# Patient Record
Sex: Female | Born: 1955
Health system: Southern US, Community
[De-identification: ages and names within clinical notes are randomized; demographics above are authoritative.]

## PROBLEM LIST (undated history)

## (undated) DIAGNOSIS — E079 Disorder of thyroid, unspecified: Secondary | ICD-10-CM

## (undated) DIAGNOSIS — I10 Essential (primary) hypertension: Secondary | ICD-10-CM

## (undated) DIAGNOSIS — E119 Type 2 diabetes mellitus without complications: Secondary | ICD-10-CM

## (undated) HISTORY — DX: Type 2 diabetes mellitus without complications: E11.9

## (undated) HISTORY — PX: COLONOSCOPY: SHX174

---

## 2014-10-10 ENCOUNTER — Other Ambulatory Visit: Payer: Self-pay | Admitting: Obstetrics & Gynecology

## 2014-10-10 ENCOUNTER — Other Ambulatory Visit (HOSPITAL_COMMUNITY)
Admission: RE | Admit: 2014-10-10 | Discharge: 2014-10-10 | Disposition: A | Discharge status: Home / Self Care | Payer: BLUE CROSS/BLUE SHIELD | Source: Ambulatory Visit | Attending: Obstetrics & Gynecology | Admitting: Obstetrics & Gynecology

## 2014-10-10 DIAGNOSIS — Z1231 Encounter for screening mammogram for malignant neoplasm of breast: Secondary | ICD-10-CM

## 2014-10-10 DIAGNOSIS — Z01419 Encounter for gynecological examination (general) (routine) without abnormal findings: Secondary | ICD-10-CM | POA: Insufficient documentation

## 2014-10-10 DIAGNOSIS — Z1151 Encounter for screening for human papillomavirus (HPV): Secondary | ICD-10-CM | POA: Insufficient documentation

## 2014-10-14 LAB — CYTOLOGY - PAP

## 2014-10-15 LAB — HM DEXA SCAN

## 2014-10-29 ENCOUNTER — Ambulatory Visit: Payer: BLUE CROSS/BLUE SHIELD

## 2015-01-26 ENCOUNTER — Encounter (HOSPITAL_COMMUNITY): Payer: Self-pay | Admitting: Emergency Medicine

## 2015-01-26 ENCOUNTER — Emergency Department (HOSPITAL_COMMUNITY): Payer: BLUE CROSS/BLUE SHIELD

## 2015-01-26 ENCOUNTER — Emergency Department (HOSPITAL_COMMUNITY)
Admission: EM | Admit: 2015-01-26 | Discharge: 2015-01-26 | Disposition: A | Discharge status: Home / Self Care | Payer: BLUE CROSS/BLUE SHIELD | Attending: Physician Assistant | Admitting: Physician Assistant

## 2015-01-26 DIAGNOSIS — Z79899 Other long term (current) drug therapy: Secondary | ICD-10-CM | POA: Insufficient documentation

## 2015-01-26 DIAGNOSIS — E079 Disorder of thyroid, unspecified: Secondary | ICD-10-CM | POA: Insufficient documentation

## 2015-01-26 DIAGNOSIS — W230XXA Caught, crushed, jammed, or pinched between moving objects, initial encounter: Secondary | ICD-10-CM | POA: Insufficient documentation

## 2015-01-26 DIAGNOSIS — S62664A Nondisplaced fracture of distal phalanx of right ring finger, initial encounter for closed fracture: Secondary | ICD-10-CM | POA: Diagnosis not present

## 2015-01-26 DIAGNOSIS — Y9289 Other specified places as the place of occurrence of the external cause: Secondary | ICD-10-CM | POA: Insufficient documentation

## 2015-01-26 DIAGNOSIS — S62662A Nondisplaced fracture of distal phalanx of right middle finger, initial encounter for closed fracture: Secondary | ICD-10-CM | POA: Insufficient documentation

## 2015-01-26 DIAGNOSIS — S62639A Displaced fracture of distal phalanx of unspecified finger, initial encounter for closed fracture: Secondary | ICD-10-CM

## 2015-01-26 DIAGNOSIS — Y998 Other external cause status: Secondary | ICD-10-CM | POA: Insufficient documentation

## 2015-01-26 DIAGNOSIS — Y9389 Activity, other specified: Secondary | ICD-10-CM | POA: Insufficient documentation

## 2015-01-26 DIAGNOSIS — S6991XA Unspecified injury of right wrist, hand and finger(s), initial encounter: Secondary | ICD-10-CM | POA: Diagnosis present

## 2015-01-26 DIAGNOSIS — I10 Essential (primary) hypertension: Secondary | ICD-10-CM | POA: Insufficient documentation

## 2015-01-26 HISTORY — DX: Essential (primary) hypertension: I10

## 2015-01-26 HISTORY — DX: Disorder of thyroid, unspecified: E07.9

## 2015-01-26 MED ORDER — OXYCODONE-ACETAMINOPHEN 5-325 MG PO TABS: 2.0000 | ORAL_TABLET | ORAL | Status: DC | PRN

## 2015-01-26 MED ORDER — OXYCODONE-ACETAMINOPHEN 5-325 MG PO TABS
1.0000 | ORAL_TABLET | Freq: Once | ORAL | Status: AC
  Administered 2015-01-26: 1 via ORAL
  Filled 2015-01-26: qty 1

## 2015-01-26 NOTE — ED Provider Notes (Signed)
CSN: [id number]     Arrival date & time 01/26/15  1057 History   First MD Initiated Contact with Patient 01/26/15 1101     Chief Complaint  Patient presents with  . Hand Injury   (Consider location/radiation/quality/duration/timing/severity/associated sxs/prior Treatment) The history is provided by the patient and the spouse. No language interpreter was used.  Ms. Kimberly Combs is a 59 y.o female with a history of thyroid disease and hypertension who presents for right hand paint after trying to pull the door down.  She states the garage door has 3 parts and the hand got stuck in the hinge between 2 sections.  She reports immediate pain.  She denies treatment prior to arrival.  She is right handed. She denies any numbness or tingling.    Past Medical History  Diagnosis Date  . Thyroid disease   . Hypertension    History reviewed. No pertinent past surgical history. No family history on file. Social History  Substance Use Topics  . Smoking status: Never Smoker   . Smokeless tobacco: None  . Alcohol Use: No   OB History    No data available     Review of Systems  Musculoskeletal: Positive for arthralgias.  Skin: Negative for color change and wound.  Neurological: Negative for numbness.      Allergies  Review of patient's allergies indicates no known allergies.  Home Medications   Prior to Admission medications   Medication Sig Start Date End Date Taking? Authorizing Provider  losartan (COZAAR) 100 MG tablet Take 1 tablet by mouth once daily 10/20/14  Yes Historical Provider, MD  SYNTHROID 150 MCG tablet Take 1 tablet by mouth once daily before breakfast 11/02/14  Yes Historical Provider, MD  oxyCODONE-acetaminophen (PERCOCET/ROXICET) 5-325 MG tablet Take 2 tablets by mouth every 4 (four) hours as needed for severe pain. 01/26/15   Darthy Manganelli Patel-Mills, PA-C   BP 140/72 mmHg  Pulse 64  Temp(Src) 97.5 F (36.4 C) (Oral)  Resp 16  SpO2 98% Physical Exam  Constitutional: She is  oriented to person, place, and time. She appears well-developed and well-nourished.  HENT:  Head: Normocephalic and atraumatic.  Eyes: Conjunctivae are normal.  Neck: Normal range of motion. Neck supple.  Cardiovascular: Normal rate.   Pulmonary/Chest: Effort normal. No respiratory distress.  Musculoskeletal: Normal range of motion.  Right hand: <2 second capillary refill in all fingers.  Able to flex and extend all fingers.  Tenderness along the 2nd, 3rd, and 4th distal phalanx but no deformity.  2+ radial pulse. No wrist pain and able to flex and extend wrist. No abrasion or laceration.   Neurological: She is alert and oriented to person, place, and time.  Skin: Skin is warm and dry.  Nursing note and vitals reviewed.   ED Course  Procedures (including critical care time) Labs Review Labs Reviewed - No data to display  Imaging Review Dg Hand Complete Right  01/26/2015  CLINICAL DATA:  [place] door slammed on fingers.  Pain. EXAM: RIGHT HAND - COMPLETE 3+ VIEW COMPARISON:  None. FINDINGS: There are nondisplaced fractures of the distal third and fourth phalanges. These fractures demonstrate no intra-articular extension. There is no associated foreign body or dislocation. IMPRESSION: Nondisplaced fractures of the distal third and fourth phalanges. Electronically Signed   By: Richardean Sale M.D.   On: 01/26/2015 11:47   I have personally reviewed and evaluated these image results as part of my medical decision-making.   EKG Interpretation None      MDM  Final diagnoses:  Distal phalanx or phalanges, closed fracture, initial encounter  Patient presents for right hand injury that occurred just prior to arrival. Her exam is not concerning for dislocation but may have distal phalanx fractures.  Xray of the right hand shows nondisplaced fractures of the distal third and fourth phalanx. The fingers were buddy taped. I discussed follow-up with hand surgery. I also explained that this  would take a couple weeks to heal. She was given instructions to take ibuprofen or Tylenol for pain and Percocet for breakthrough pain. She is agreeable with plan.  Medications  oxyCODONE-acetaminophen (PERCOCET/ROXICET) 5-325 MG per tablet 1 tablet (1 tablet Oral Given 01/26/15 1110)   Filed Vitals:   01/26/15 1107 01/26/15 1241  BP:  140/72  Pulse:  64  Temp: 97.5 F (36.4 C)   Resp:  [address], PA-C 01/26/15 1419  Courteney Lyn Mackuen, MD 01/26/15 1512

## 2015-01-26 NOTE — Discharge Instructions (Signed)
Finger Fracture Follow-up with the hand surgeon. Take Tylenol or Motrin for pain and Percocet for breakthrough pain. Fractures of fingers are breaks in the bones of the fingers. There are many types of fractures. There are different ways of treating these fractures. Your health care provider will discuss the best way to treat your fracture. CAUSES Traumatic injury is the main cause of broken fingers. These include:  Injuries while playing sports.  Workplace injuries.  Falls. RISK FACTORS Activities that can increase your risk of finger fractures include:  Sports.  Workplace activities that involve machinery.  A condition called osteoporosis, which can make your bones less dense and cause them to fracture more easily. SIGNS AND SYMPTOMS The main symptoms of a broken finger are pain and swelling within 15 minutes after the injury. Other symptoms include:  Bruising of your finger.  Stiffness of your finger.  Numbness of your finger.  Exposed bones (compound fracture) if the fracture is severe. DIAGNOSIS  The best way to diagnose a broken bone is with X-ray imaging. Additionally, your health care provider will use this X-ray image to evaluate the position of the broken finger bones.  TREATMENT  Finger fractures can be treated with:   Nonreduction--This means the bones are in place. The finger is splinted without changing the positions of the bone pieces. The splint is usually left on for about a week to 10 days. This will depend on your fracture and what your health care provider thinks.  Closed reduction--The bones are put back into position without using surgery. The finger is then splinted.  Open reduction and internal fixation--The fracture site is opened. Then the bone pieces are fixed into place with pins or some type of hardware. This is seldom required. It depends on the severity of the fracture. HOME CARE INSTRUCTIONS   Follow your health care provider's instructions  regarding activities, exercises, and physical therapy.  Only take over-the-counter or prescription medicines for pain, discomfort, or fever as directed by your health care provider. SEEK MEDICAL CARE IF: You have pain or swelling that limits the motion or use of your fingers. SEEK IMMEDIATE MEDICAL CARE IF:  Your finger becomes numb. MAKE SURE YOU:   Understand these instructions.  Will watch your condition.  Will get help right away if you are not doing well or get worse.   This information is not intended to replace advice given to you by your health care provider. Make sure you discuss any questions you have with your health care provider.   Document Released: 05/02/2000 Document Revised: 11/08/2012 Document Reviewed: 08/30/2012 Elsevier Interactive Patient Education [employer].

## 2015-01-26 NOTE — ED Notes (Signed)
Per pt, states got right index, middle, and ring finger caught in garage door

## 2015-10-14 ENCOUNTER — Other Ambulatory Visit: Payer: Self-pay | Admitting: Obstetrics & Gynecology

## 2015-10-14 DIAGNOSIS — Z1231 Encounter for screening mammogram for malignant neoplasm of breast: Secondary | ICD-10-CM

## 2015-10-22 ENCOUNTER — Ambulatory Visit
Admission: RE | Admit: 2015-10-22 | Discharge: 2015-10-22 | Disposition: A | Discharge status: Home / Self Care | Payer: BLUE CROSS/BLUE SHIELD | Source: Ambulatory Visit | Attending: Obstetrics & Gynecology | Admitting: Obstetrics & Gynecology

## 2015-10-22 DIAGNOSIS — Z1231 Encounter for screening mammogram for malignant neoplasm of breast: Secondary | ICD-10-CM

## 2016-09-14 ENCOUNTER — Other Ambulatory Visit: Payer: Self-pay | Admitting: Obstetrics & Gynecology

## 2016-09-14 DIAGNOSIS — Z1231 Encounter for screening mammogram for malignant neoplasm of breast: Secondary | ICD-10-CM

## 2016-11-01 ENCOUNTER — Ambulatory Visit: Payer: BLUE CROSS/BLUE SHIELD

## 2016-12-07 ENCOUNTER — Ambulatory Visit: Payer: BLUE CROSS/BLUE SHIELD

## 2016-12-29 LAB — HM DIABETES EYE EXAM

## 2017-05-31 ENCOUNTER — Ambulatory Visit: Payer: BLUE CROSS/BLUE SHIELD | Admitting: Internal Medicine

## 2017-05-31 ENCOUNTER — Encounter: Payer: Self-pay | Admitting: Gastroenterology

## 2017-05-31 ENCOUNTER — Encounter: Payer: Self-pay | Admitting: Neurology

## 2017-05-31 ENCOUNTER — Encounter: Payer: Self-pay | Admitting: Internal Medicine

## 2017-05-31 VITALS — BP 126/70 | HR 63 | Temp 97.8°F | Resp 14 | Ht 65.75 in | Wt 199.5 lb

## 2017-05-31 DIAGNOSIS — E039 Hypothyroidism, unspecified: Secondary | ICD-10-CM

## 2017-05-31 DIAGNOSIS — H9311 Tinnitus, right ear: Secondary | ICD-10-CM | POA: Diagnosis not present

## 2017-05-31 DIAGNOSIS — E118 Type 2 diabetes mellitus with unspecified complications: Secondary | ICD-10-CM | POA: Diagnosis not present

## 2017-05-31 DIAGNOSIS — E079 Disorder of thyroid, unspecified: Secondary | ICD-10-CM

## 2017-05-31 DIAGNOSIS — I10 Essential (primary) hypertension: Secondary | ICD-10-CM | POA: Diagnosis not present

## 2017-05-31 DIAGNOSIS — G2581 Restless legs syndrome: Secondary | ICD-10-CM | POA: Diagnosis not present

## 2017-05-31 DIAGNOSIS — Z1211 Encounter for screening for malignant neoplasm of colon: Secondary | ICD-10-CM

## 2017-05-31 DIAGNOSIS — E119 Type 2 diabetes mellitus without complications: Secondary | ICD-10-CM

## 2017-05-31 LAB — COMPREHENSIVE METABOLIC PANEL
ALT: 33 U/L (ref 0–35)
AST: 22 U/L (ref 0–37)
Albumin: 4.3 g/dL (ref 3.5–5.2)
Alkaline Phosphatase: 62 U/L (ref 39–117)
BUN: 11 mg/dL (ref 6–23)
CHLORIDE: 103 meq/L (ref 96–112)
CO2: 30 meq/L (ref 19–32)
Calcium: 9.4 mg/dL (ref 8.4–10.5)
Creatinine, Ser: 0.72 mg/dL (ref 0.40–1.20)
GFR: 87.29 mL/min (ref >60.00)
GLUCOSE: 141 mg/dL — AB (ref 70–99)
POTASSIUM: 3.8 meq/L (ref 3.5–5.1)
SODIUM: 141 meq/L (ref 135–145)
Total Bilirubin: 0.5 mg/dL (ref 0.2–1.2)
Total Protein: 7.6 g/dL (ref 6.0–8.3)

## 2017-05-31 LAB — LIPID PANEL
CHOL/HDL RATIO: 3
Cholesterol: 193 mg/dL (ref 0–200)
HDL: 74.2 mg/dL (ref >39.00)
LDL CALC: 99 mg/dL (ref 0–99)
NONHDL: 119.22
Triglycerides: 100 mg/dL (ref 0.0–149.0)
VLDL: 20 mg/dL (ref 0.0–40.0)

## 2017-05-31 LAB — TSH: TSH: 6.49 u[IU]/mL — ABNORMAL HIGH (ref 0.35–4.50)

## 2017-05-31 LAB — HEMOGLOBIN A1C: HEMOGLOBIN A1C: 7.4 % — AB (ref 4.6–6.5)

## 2017-05-31 NOTE — Progress Notes (Signed)
Subjective:    Patient ID: Kimberly Combs, female    DOB: May 07, 1955, 62 y.o.   MRN: [id number]  DOS:  05/31/2017 Type of visit - description : New patient Interval history: Needs to get established , last visit without MD about 9 months ago DM: Good compliance of medications, ambulatory CBGs in the 120s HTN: Good compliance with losartan, ambulatory BPs within normal Hypothyroidism: Due for labs Right ear tinnitus for a while, request a ENT referral.  Denies HOH. Also, 3 months history of involuntary leg movements, at night, associated with pain.  Review of Systems Denies chest pain or difficulty breathing No claudication No nausea, vomiting, diarrhea. No anxiety or depression although has some stress because she takes care of her parents who are elderly.  Past Medical History:  Diagnosis Date  . Diabetes mellitus ([place])   . Hypertension   . Thyroid disease     Past Surgical History:  Procedure Laterality Date  . CESAREAN SECTION     X2    Social History   Socioeconomic History  . Marital status: Married    Spouse name: Not on file  . Number of children: 2  . Years of education: Not on file  . Highest education level: Not on file  Occupational History  . Occupation: retired - Education officer, museum , [place]  Social Needs  . Financial resource strain: Not on file  . Food insecurity:    Worry: Not on file    Inability: Not on file  . Transportation needs:    Medical: Not on file    Non-medical: Not on file  Tobacco Use  . Smoking status: Former Smoker    Last attempt to quit: 2000    Years since quitting: 19.3  . Smokeless tobacco: Never Used  . Tobacco comment: light smoker, quit ~ 2000  Substance and Sexual Activity  . Alcohol use: No  . Drug use: Never  . Sexual activity: Not on file  Lifestyle  . Physical activity:    Days per week: Not on file    Minutes per session: Not on file  . Stress: Not on file  Relationships  . Social connections:    Talks on  phone: Not on file    Gets together: Not on file    Attends religious service: Not on file    Active member of club or organization: Not on file    Attends meetings of clubs or organizations: Not on file    Relationship status: Not on file  . Intimate partner violence:    Fear of current or ex partner: Not on file    Emotionally abused: Not on file    Physically abused: Not on file    Forced sexual activity: Not on file  Other Topics Concern  . Not on file  Social History Narrative   Born in [place]   Moved from [place] to [place] ~ 2015     Family History  Problem Relation Age of Onset  . Arthritis Mother   . Diabetes Father   . CAD Neg Hx   . Colon cancer Neg Hx   . Breast cancer Neg Hx      Allergies as of 05/31/2017   No Known Allergies     Medication List        Accurate as of 05/31/17 11:59 PM. Always use your most recent med list.          glucose blood test strip 1 each by Other route  3 (three) times daily as needed for other. Pt using Ultra Mini device   levothyroxine 88 MCG tablet Commonly known as:  SYNTHROID, LEVOTHROID Take 88 mcg by mouth daily before breakfast.   losartan 50 MG tablet Commonly known as:  COZAAR Take 50 mg by mouth daily.   metFORMIN 500 MG 24 hr tablet Commonly known as:  GLUCOPHAGE-XR Take 500 mg by mouth daily with supper.          Objective:   Physical Exam BP 126/70 (BP Location: Left Arm, Patient Position: Sitting, Cuff Size: Normal)   Pulse 63   Temp 97.8 F (36.6 C) (Oral)   Resp 14   Ht 5' 5.75" (1.67 m)   Wt 199 lb 8 oz (90.5 kg)   SpO2 97%   BMI 32.45 kg/m  General:   Well developed, well nourished . NAD.  HEENT:  Normocephalic . Face symmetric, atraumatic Lungs:  CTA B Normal respiratory effort, no intercostal retractions, no accessory muscle use. Heart: RRR,  no murmur.  no pretibial edema bilaterally Normal pedal pulses Abdomen:  Not distended, soft, non-tender. No rebound or rigidity.   Skin: Not pale.  Not jaundice Neurologic:  alert & oriented X3.  Speech normal, gait appropriate for age and unassisted Psych--  Cognition and judgment appear intact.  Cooperative with normal attention span and concentration.  Behavior appropriate. No anxious or depressed appearing.     Assessment & Plan:   Assessment (this is new patient 05/2017, used to see Dr. Delfina Redwood, this office is more conveniently located) DM: dx ~ 2014 HTN: dx ~ 2016 Hypothyroidism dx remotely   Plan: DM: Currently on metformin, ambulatory CBGs 120s.  Check a A1c and FLP HTN: On losartan, BP today is very good, reports good ambulatory BPs.  Check a CMP Hypothyroidism: Good compliance with Synthroid, checking labs Tinnitus, right ear.  Request a referral, refer to ENT RLS?  Painful, involuntary movements of the legs bilaterally, refer to neurology. Preventive care: Had a colonoscopy [place] more than 10 years ago, request a referral to GI.  Will do. Next  visit 3 months

## 2017-05-31 NOTE — Patient Instructions (Signed)
GO TO THE LAB : Get the blood work     GO TO THE FRONT DESK Schedule your next appointment for a  checkup in 3 months  

## 2017-05-31 NOTE — Progress Notes (Signed)
Pre visit review using our clinic review tool, if applicable. No additional management support is needed unless otherwise documented below in the visit note. 

## 2017-06-01 DIAGNOSIS — I10 Essential (primary) hypertension: Secondary | ICD-10-CM | POA: Insufficient documentation

## 2017-06-01 DIAGNOSIS — E119 Type 2 diabetes mellitus without complications: Secondary | ICD-10-CM | POA: Insufficient documentation

## 2017-06-01 DIAGNOSIS — E039 Hypothyroidism, unspecified: Secondary | ICD-10-CM | POA: Insufficient documentation

## 2017-06-01 DIAGNOSIS — E079 Disorder of thyroid, unspecified: Secondary | ICD-10-CM | POA: Insufficient documentation

## 2017-06-01 DIAGNOSIS — Z09 Encounter for follow-up examination after completed treatment for conditions other than malignant neoplasm: Secondary | ICD-10-CM | POA: Insufficient documentation

## 2017-06-01 NOTE — Assessment & Plan Note (Signed)
DM: Currently on metformin, ambulatory CBGs 120s.  Check a A1c and FLP HTN: On losartan, BP today is very good, reports good ambulatory BPs.  Check a CMP Hypothyroidism: Good compliance with Synthroid, checking labs Tinnitus, right ear.  Request a referral, refer to ENT RLS?  Painful, involuntary movements of the legs bilaterally, refer to neurology. Preventive care: Had a colonoscopy [place] more than 10 years ago, request a referral to GI.  Will do. Next  visit 3 months

## 2017-06-02 MED ORDER — METFORMIN HCL ER 750 MG PO TB24: 1500.0000 mg | ORAL_TABLET | Freq: Every day | ORAL | 3 refills | Status: DC

## 2017-06-02 MED ORDER — LEVOTHYROXINE SODIUM 100 MCG PO TABS: 100.0000 ug | ORAL_TABLET | Freq: Every day | ORAL | 3 refills | Status: DC

## 2017-06-02 NOTE — Addendum Note (Signed)
Addended byDamita Dunnings D on: 06/02/2017 02:49 PM   Modules accepted: Orders

## 2017-06-03 MED ORDER — GLUCOSE BLOOD VI STRP: ORAL_STRIP | 12 refills | Status: DC

## 2017-06-03 NOTE — Addendum Note (Signed)
Addended byDamita Dunnings D on: 06/03/2017 08:31 AM   Modules accepted: Orders

## 2017-06-07 ENCOUNTER — Telehealth: Payer: Self-pay | Admitting: Internal Medicine

## 2017-06-07 NOTE — Telephone Encounter (Signed)
Copied from [place] [id number]. Topic: Quick Communication - See Telephone Encounter >> Jun 07, 2017  3:46 PM Bea Graff, NT wrote: CRM for notification. See Telephone encounter for: 06/07/17. Pt calling and she does not want the levothyroxine (SYNTHROID, LEVOTHROID) 100 MCG tablet she wants the name brand Synthroid. [facility] - [place], [place] AT [facility] [address] [phone] (Phone) [phone] (Fax)

## 2017-06-08 MED ORDER — SYNTHROID 100 MCG PO TABS: 100.0000 ug | ORAL_TABLET | Freq: Every day | ORAL | 3 refills | Status: DC

## 2017-06-08 NOTE — Telephone Encounter (Signed)
Name brand sent.

## 2017-06-08 NOTE — Telephone Encounter (Signed)
LOV 05/31/17 Dr. Larose Kells Last refill 06/02/17  # 30 with 3 refills See pt. request

## 2017-06-20 ENCOUNTER — Ambulatory Visit (AMBULATORY_SURGERY_CENTER): Payer: Self-pay | Admitting: *Deleted

## 2017-06-20 ENCOUNTER — Other Ambulatory Visit: Payer: Self-pay

## 2017-06-20 VITALS — Ht 65.0 in | Wt 199.0 lb

## 2017-06-20 DIAGNOSIS — Z1211 Encounter for screening for malignant neoplasm of colon: Secondary | ICD-10-CM

## 2017-06-20 MED ORDER — SOD PICOSULFATE-MAG OX-CIT ACD 10-3.5-12 MG-GM -GM/160ML PO SOLN: 1.0000 | Freq: Once | ORAL | 0 refills | Status: AC

## 2017-06-20 NOTE — Progress Notes (Signed)
Patient denies any allergies to eggs or soy. Patient denies any problems with anesthesia/sedation. Patient denies any oxygen use at home. Patient denies taking any diet/weight loss medications or blood thinners. EMMI education assisgned to patient on colonoscopy, this was explained and instructions given to patient. Patient denies any kidney disease or problems with kidneys. Clenpiq coupon given to pt.

## 2017-06-30 ENCOUNTER — Ambulatory Visit
Admission: RE | Admit: 2017-06-30 | Discharge: 2017-06-30 | Disposition: A | Discharge status: Home / Self Care | Payer: BLUE CROSS/BLUE SHIELD | Source: Ambulatory Visit | Attending: Obstetrics & Gynecology | Admitting: Obstetrics & Gynecology

## 2017-06-30 DIAGNOSIS — Z1231 Encounter for screening mammogram for malignant neoplasm of breast: Secondary | ICD-10-CM

## 2017-07-04 ENCOUNTER — Encounter: Payer: Self-pay | Admitting: Gastroenterology

## 2017-07-04 ENCOUNTER — Ambulatory Visit (AMBULATORY_SURGERY_CENTER): Payer: BLUE CROSS/BLUE SHIELD | Admitting: Gastroenterology

## 2017-07-04 VITALS — BP 122/64 | HR 67 | Temp 98.4°F | Resp 11 | Ht 65.0 in | Wt 199.0 lb

## 2017-07-04 DIAGNOSIS — K635 Polyp of colon: Secondary | ICD-10-CM

## 2017-07-04 DIAGNOSIS — D120 Benign neoplasm of cecum: Secondary | ICD-10-CM

## 2017-07-04 DIAGNOSIS — D122 Benign neoplasm of ascending colon: Secondary | ICD-10-CM

## 2017-07-04 DIAGNOSIS — Z1211 Encounter for screening for malignant neoplasm of colon: Secondary | ICD-10-CM | POA: Diagnosis not present

## 2017-07-04 MED ORDER — SODIUM CHLORIDE 0.9 % IV SOLN: 500.0000 mL | Freq: Once | INTRAVENOUS | Status: DC

## 2017-07-04 NOTE — Progress Notes (Signed)
To recovery, report to RN, VSS. 

## 2017-07-04 NOTE — Patient Instructions (Signed)
Handout given on polyps  YOU HAD AN ENDOSCOPIC PROCEDURE TODAY: Refer to the procedure report and other information in the discharge instructions given to you for any specific questions about what was found during the examination. If this information does not answer your questions, please call [place] office at [phone] to clarify.   YOU SHOULD EXPECT: Some feelings of bloating in the abdomen. Passage of more gas than usual. Walking can help get rid of the air that was put into your GI tract during the procedure and reduce the bloating. If you had a lower endoscopy (such as a colonoscopy or flexible sigmoidoscopy) you may notice spotting of blood in your stool or on the toilet paper. Some abdominal soreness may be present for a day or two, also.  DIET: Your first meal following the procedure should be a light meal and then it is ok to progress to your normal diet. A half-sandwich or bowl of soup is an example of a good first meal. Heavy or fried foods are harder to digest and may make you feel nauseous or bloated. Drink plenty of fluids but you should avoid alcoholic beverages for 24 hours. If you had a esophageal dilation, please see attached instructions for diet.    ACTIVITY: Your care partner should take you home directly after the procedure. You should plan to take it easy, moving slowly for the rest of the day. You can resume normal activity the day after the procedure however YOU SHOULD NOT DRIVE, use power tools, machinery or perform tasks that involve climbing or major physical exertion for 24 hours (because of the sedation medicines used during the test).   SYMPTOMS TO REPORT IMMEDIATELY: A gastroenterologist can be reached at any hour. Please call [phone]  for any of the following symptoms:  Following lower endoscopy (colonoscopy, flexible sigmoidoscopy) Excessive amounts of blood in the stool  Significant tenderness, worsening of abdominal pains  Swelling of the abdomen that is  new, acute  Fever of 100 or higher    FOLLOW UP:  If any biopsies were taken you will be contacted by phone or by letter within the next 1-3 weeks. Call [phone]  if you have not heard about the biopsies in 3 weeks.  Please also call with any specific questions about appointments or follow up tests.  

## 2017-07-04 NOTE — Progress Notes (Signed)
Pt's states no medical or surgical changes since previsit or office visit. 

## 2017-07-04 NOTE — Progress Notes (Signed)
Called to room to assist during endoscopic procedure.  Patient ID and intended procedure confirmed with present staff. Received instructions for my participation in the procedure from the performing physician.  

## 2017-07-04 NOTE — Op Note (Signed)
[facility] Patient Name: Kimberly Combs Procedure Date: 07/04/2017 10:07 AM MRN: [id number] Endoscopist: Jackquline Denmark , MD Age: 62 Referring MD:  Date of Birth: 01-09-1956 Gender: Female Account #: [id number] Procedure:                Colonoscopy Indications:              Screening for colorectal malignant neoplasm Medicines:                Monitored Anesthesia Care Procedure:                Pre-Anesthesia Assessment:                           - Prior to the procedure, a History and Physical                            was performed, and patient medications and                            allergies were reviewed. The patient is competent.                            The risks and benefits of the procedure and the                            sedation options and risks were discussed with the                            patient. All questions were answered and informed                            consent was obtained. Patient identification and                            proposed procedure were verified by the physician                            in the procedure room. Mental Status Examination:                            alert and oriented. Prophylactic Antibiotics: The                            patient does not require prophylactic antibiotics.                            Prior Anticoagulants: The patient has taken no                            previous anticoagulant or antiplatelet agents. ASA                            Grade Assessment: II - A patient with mild systemic  disease. After reviewing the risks and benefits,                            the patient was deemed in satisfactory condition to                            undergo the procedure. The anesthesia plan was to                            use monitored anesthesia care (MAC). Immediately                            prior to administration of medications, the patient                            was  re-assessed for adequacy to receive sedatives.                            The heart rate, respiratory rate, oxygen                            saturations, blood pressure, adequacy of pulmonary                            ventilation, and response to care were monitored                            throughout the procedure. The physical status of                            the patient was re-assessed after the procedure.                           After obtaining informed consent, the colonoscope                            was passed under direct vision. Throughout the                            procedure, the patient's blood pressure, pulse, and                            oxygen saturations were monitored continuously. The                            Colonoscope was introduced through the anus and                            advanced to the 2 cm into the ileum. The                            colonoscopy was performed without difficulty. The  patient tolerated the procedure well. The quality                            of the bowel preparation was adequate to identify                            polyps 6 mm and larger in size. Some retained                            stool. Aggressive suctioning and aspiration was                            performed. Photo documentation obtained. Scope In: 10:16:45 AM Scope Out: 10:38:32 AM Scope Withdrawal Time: 0 hours 18 minutes 1 second  Total Procedure Duration: 0 hours 21 minutes 47 seconds  Findings:                 Three sessile polyps were found in the ascending                            colon and cecum. The polyps were 4 mm in size.                            These polyps were removed with a cold biopsy                            forceps. Resection and retrieval were complete.                           A 6 mm polyp was found in the ascending colon. The                            polyp was sessile. The polyp was removed with a                             cold snare. Resection and retrieval were complete.                           Non-bleeding internal hemorrhoids were found during                            retroflexion. The hemorrhoids were small and Grade                            II (internal hemorrhoids that prolapse but reduce                            spontaneously). Complications:            No immediate complications. Estimated Blood Loss:     Estimated blood loss: none. Impression:               - Colonic polyps status post polypectomy.                           -  Small Internal hemorrhoids. Recommendation:           - Resume previous diet.                           - Continue present medications.                           - Await pathology results.                           - Repeat colonoscopy for surveillance based on                            pathology results. Jackquline Denmark, MD 07/04/2017 10:45:52 AM This report has been signed electronically.

## 2017-07-05 ENCOUNTER — Telehealth: Payer: Self-pay | Admitting: *Deleted

## 2017-07-05 NOTE — Telephone Encounter (Signed)
  Follow up Call-  Call back number 07/04/2017  Post procedure Call Back phone  # [phone]  Permission to leave phone message Yes     Patient questions:  All of the message is in [place].  Daughter staed yesterday that she would answer, but did not.  No message left.

## 2017-07-05 NOTE — Telephone Encounter (Signed)
No answer, recording in Spanish, no opportunity to leave a message.

## 2017-07-07 ENCOUNTER — Other Ambulatory Visit: Payer: BLUE CROSS/BLUE SHIELD

## 2017-07-07 ENCOUNTER — Encounter: Payer: Self-pay | Admitting: Neurology

## 2017-07-07 ENCOUNTER — Ambulatory Visit (INDEPENDENT_AMBULATORY_CARE_PROVIDER_SITE_OTHER): Payer: BLUE CROSS/BLUE SHIELD | Admitting: Neurology

## 2017-07-07 VITALS — BP 130/70 | HR 68 | Ht 65.0 in | Wt 199.0 lb

## 2017-07-07 DIAGNOSIS — G2581 Restless legs syndrome: Secondary | ICD-10-CM

## 2017-07-07 DIAGNOSIS — G4761 Periodic limb movement disorder: Secondary | ICD-10-CM

## 2017-07-07 DIAGNOSIS — Z1321 Encounter for screening for nutritional disorder: Secondary | ICD-10-CM

## 2017-07-07 DIAGNOSIS — E119 Type 2 diabetes mellitus without complications: Secondary | ICD-10-CM

## 2017-07-07 MED ORDER — GABAPENTIN 100 MG PO CAPS: 300.0000 mg | ORAL_CAPSULE | Freq: Every day | ORAL | 2 refills | Status: DC

## 2017-07-07 NOTE — Progress Notes (Signed)
NEUROLOGY CONSULTATION NOTE  Kimberly Combs MRN: [id number] DOB: [birth year]  Referring provider: Dr. Larose Kells Primary care provider: Dr. Larose Kells  Reason for consult:  Restless leg syndrome  HISTORY OF PRESENT ILLNESS: Kimberly Combs is a 62 year old right-handed female with type 2 diabetes mellitus, hypothyroidism and hypertension who presents for restless leg syndrome.  She is accompanied by her husband who supplements history.  She reports uncomfortable sensations in her legs at night.  When she lays in bed, she reports a "creepy crawly" sensation with some numbness.  She denies pain or weakness. She denies back pain.  She only gets relief when she moves her legs.  Her husband says she will kick in her sleep as well.  It is not noticeable during the day.  05/31/17:  CMP with Na 141, K 3.8, glucose 141, BUN 11, Cr 0.72, t bili 0.5, ALP 62, AST 22, ALT 33; Hgb A1c 7.4, TSH 6.49  PAST MEDICAL HISTORY: Past Medical History:  Diagnosis Date  . Diabetes mellitus ([place])   . Hypertension   . Thyroid disease     PAST SURGICAL HISTORY: Past Surgical History:  Procedure Laterality Date  . CESAREAN SECTION  [id number]   X2  . COLONOSCOPY  10 years ago    in [place]="normal exam" per pt    MEDICATIONS: Current Outpatient Medications on File Prior to Visit  Medication Sig Dispense Refill  . acetaminophen (TYLENOL) 500 MG tablet Take 500 mg by mouth every 6 (six) hours as needed.    Marland Kitchen b complex vitamins tablet Take 1 tablet by mouth daily.    Marland Kitchen glucose blood test strip Check blood sugar three times daily Pt using Ultra Mini device 300 each 12  . losartan (COZAAR) 50 MG tablet Take 50 mg by mouth daily.    . metFORMIN (GLUCOPHAGE XR) 750 MG 24 hr tablet Take 2 tablets (1,500 mg total) by mouth daily with breakfast. 60 tablet 3  . SYNTHROID 100 MCG tablet Take 1 tablet (100 mcg total) by mouth daily before breakfast. 30 tablet 3   Current Facility-Administered Medications on File Prior to Visit    Medication Dose Route Frequency Provider Last Rate Last Dose  . 0.9 %  sodium chloride infusion  500 mL Intravenous Once Jackquline Denmark, MD        ALLERGIES: No Known Allergies  FAMILY HISTORY: Family History  Problem Relation Age of Onset  . Arthritis Mother   . Diabetes Father   . CAD Neg Hx   . Colon cancer Neg Hx   . Breast cancer Neg Hx   . Esophageal cancer Neg Hx   . Stomach cancer Neg Hx     SOCIAL HISTORY: Social History   Socioeconomic History  . Marital status: Married    Spouse name: Ray  . Number of children: 2  . Years of education: Not on file  . Highest education level: Bachelor's degree (e.g., BA, AB, BS)  Occupational History  . Occupation: retired - Education officer, museum , [place]  Social Needs  . Financial resource strain: Not on file  . Food insecurity:    Worry: Not on file    Inability: Not on file  . Transportation needs:    Medical: Not on file    Non-medical: Not on file  Tobacco Use  . Smoking status: Former Smoker    Last attempt to quit: 2000    Years since quitting: 19.4  . Smokeless tobacco: Never Used  . Tobacco comment: light smoker,  quit ~ 2000  Substance and Sexual Activity  . Alcohol use: No  . Drug use: Never  . Sexual activity: Not on file  Lifestyle  . Physical activity:    Days per week: Not on file    Minutes per session: Not on file  . Stress: Not on file  Relationships  . Social connections:    Talks on phone: Not on file    Gets together: Not on file    Attends religious service: Not on file    Active member of club or organization: Not on file    Attends meetings of clubs or organizations: Not on file    Relationship status: Not on file  . Intimate partner violence:    Fear of current or ex partner: Not on file    Emotionally abused: Not on file    Physically abused: Not on file    Forced sexual activity: Not on file  Other Topics Concern  . Not on file  Social History Narrative   Born in [place]   Moved from  [place] to [place] ~ 2015      Patient is right-handed. She lives with her husband in a 2 story house. She drinks one cup of deca coffee most days. She nad her husband walk occasionally.    REVIEW OF SYSTEMS: Constitutional: No fevers, chills, or sweats, no generalized fatigue, change in appetite Eyes: No visual changes, double vision, eye pain Ear, nose and throat: No hearing loss, ear pain, nasal congestion, sore throat Cardiovascular: No chest pain, palpitations Respiratory:  No shortness of breath at rest or with exertion, wheezes GastrointestinaI: No nausea, vomiting, diarrhea, abdominal pain, fecal incontinence Genitourinary:  No dysuria, urinary retention or frequency Musculoskeletal:  No neck pain, back pain Integumentary: No rash, pruritus, skin lesions Neurological: as above Psychiatric: No depression, insomnia, anxiety Endocrine: No palpitations, fatigue, diaphoresis, mood swings, change in appetite, change in weight, increased thirst Hematologic/Lymphatic:  No purpura, petechiae. Allergic/Immunologic: no itchy/runny eyes, nasal congestion, recent allergic reactions, rashes  PHYSICAL EXAM: Vitals:   07/07/17 1000  BP: 130/70  Pulse: 68  SpO2: 98%   General: No acute distress.  Patient appears well-groomed.  Head:  Normocephalic/atraumatic Eyes:  fundi examined but not visualized Neck: supple, no paraspinal tenderness, full range of motion Back: No paraspinal tenderness Heart: regular rate and rhythm Lungs: Clear to auscultation bilaterally. Vascular: No carotid bruits. Neurological Exam: Mental status: alert and oriented to person, place, and time, recent and remote memory intact, fund of knowledge intact, attention and concentration intact, speech fluent and not dysarthric, language intact. Cranial nerves: CN I: not tested CN II: pupils equal, round and reactive to light, visual fields intact CN III, IV, VI:  full range of motion, no nystagmus, no ptosis CN V: facial  sensation intact CN VII: upper and lower face symmetric CN VIII: hearing intact CN IX, X: gag intact, uvula midline CN XI: sternocleidomastoid and trapezius muscles intact CN XII: tongue midline Bulk & Tone: normal, no fasciculations. Motor:  5/5 throughout  Sensation:  Pinprick and vibration sensation intact. Deep Tendon Reflexes:  2+ throughout, toes downgoing.  Finger to nose testing:  Without dysmetria.  Heel to shin:  Without dysmetria.  Gait:  Normal station and stride.  Able to turn and tandem walk. Romberg negative.  IMPRESSION: Restless leg syndrome Period limb movements of sleep Type 2 diabetes mellitus  PLAN: 1.  Start gabapentin 100mg .  Take 3 pills at night (about 2 hours prior to bedtime).  If symptoms not better in 4 weeks, contact me 2.  We will check secondary causes:  B12, ferritin and CBC 3.  Optimize glycemic control 4.  Follow up in 3 months.  Thank you for allowing me to take part in the care of this patient.  Metta Clines, DO  CC:  Kathlene November, MD

## 2017-07-07 NOTE — Patient Instructions (Addendum)
1.  Start gabapentin 100mg .  Take 3 pills at night (about 2 hours prior to bedtime).  If symptoms not better in 4 weeks, contact me 2.  We will check secondary causes:  B12, ferritin and CBC 3.  Follow up in 3 months.  Your provider has requested that you have labwork completed today. Please go to [facility] ([id number]) on the second floor of this building before leaving the office today. You do not need to check in. If you are not called within 15 minutes please check with the front desk.

## 2017-07-08 LAB — CBC
HEMATOCRIT: 36.6 % (ref 35.0–45.0)
Hemoglobin: 12.2 g/dL (ref 11.7–15.5)
MCH: 26.9 pg — ABNORMAL LOW (ref 27.0–33.0)
MCHC: 33.3 g/dL (ref 32.0–36.0)
MCV: 80.8 fL (ref 80.0–100.0)
MPV: 10.6 fL (ref 7.5–12.5)
Platelets: 306 10*3/uL (ref 140–400)
RBC: 4.53 10*6/uL (ref 3.80–5.10)
RDW: 14 % (ref 11.0–15.0)
WBC: 7.4 10*3/uL (ref 3.8–10.8)

## 2017-07-08 LAB — VITAMIN B12: Vitamin B-12: 589 pg/mL (ref 200–1100)

## 2017-07-08 LAB — FERRITIN: Ferritin: 49 ng/mL (ref 20–288)

## 2017-07-11 ENCOUNTER — Telehealth: Payer: Self-pay

## 2017-07-11 NOTE — Telephone Encounter (Signed)
-----   Message from Pieter Partridge, DO sent at 07/08/2017  7:59 AM EDT ----- Labs look okay

## 2017-07-11 NOTE — Telephone Encounter (Signed)
Called Pt to advise of lab results, no answer. VM was in spanish.  I will send letter

## 2017-07-12 ENCOUNTER — Telehealth: Payer: Self-pay | Admitting: Neurology

## 2017-07-12 NOTE — Telephone Encounter (Signed)
Patient lmom needing to get results. Thanks

## 2017-07-12 NOTE — Telephone Encounter (Signed)
Called and spoke with Pt, advsd her she will receive a letter also, but that labs all looked good.

## 2017-07-12 NOTE — Telephone Encounter (Signed)
Rcvd VM from Pt, asking for a call with lab results. Called and it went to VM, it is in Spanish and there was never a tone to LM

## 2017-07-14 ENCOUNTER — Other Ambulatory Visit (INDEPENDENT_AMBULATORY_CARE_PROVIDER_SITE_OTHER): Payer: BLUE CROSS/BLUE SHIELD

## 2017-07-14 DIAGNOSIS — E039 Hypothyroidism, unspecified: Secondary | ICD-10-CM

## 2017-07-14 LAB — TSH: TSH: 4.93 u[IU]/mL — AB (ref 0.35–4.50)

## 2017-07-14 MED ORDER — LEVOTHYROXINE SODIUM 125 MCG PO TABS: 125.0000 ug | ORAL_TABLET | Freq: Every day | ORAL | 2 refills | Status: DC

## 2017-07-14 NOTE — Addendum Note (Signed)
Addended byDamita Dunnings D on: 07/14/2017 01:32 PM   Modules accepted: Orders

## 2017-07-19 NOTE — Telephone Encounter (Signed)
Called and spoke with Pt, she is requesting a copy of the labs. Advised her I will mail today.

## 2017-07-19 NOTE — Telephone Encounter (Signed)
Pt states she has questions about lab results letter she received. Best call back # [phone].

## 2017-07-21 ENCOUNTER — Encounter: Payer: Self-pay | Admitting: Gastroenterology

## 2017-08-01 DIAGNOSIS — H9311 Tinnitus, right ear: Secondary | ICD-10-CM | POA: Diagnosis not present

## 2017-08-01 DIAGNOSIS — K219 Gastro-esophageal reflux disease without esophagitis: Secondary | ICD-10-CM | POA: Diagnosis not present

## 2017-08-01 DIAGNOSIS — Z0110 Encounter for examination of ears and hearing without abnormal findings: Secondary | ICD-10-CM | POA: Diagnosis not present

## 2017-08-01 DIAGNOSIS — H6123 Impacted cerumen, bilateral: Secondary | ICD-10-CM | POA: Diagnosis not present

## 2017-08-03 DIAGNOSIS — Z01419 Encounter for gynecological examination (general) (routine) without abnormal findings: Secondary | ICD-10-CM | POA: Diagnosis not present

## 2017-08-07 LAB — HM PAP SMEAR

## 2017-09-02 ENCOUNTER — Ambulatory Visit: Payer: BLUE CROSS/BLUE SHIELD | Admitting: Internal Medicine

## 2017-09-20 ENCOUNTER — Telehealth: Payer: Self-pay | Admitting: Internal Medicine

## 2017-09-20 NOTE — Telephone Encounter (Signed)
Due for a OV, due for a TSH recheck, please call pt and arrange

## 2017-09-21 NOTE — Telephone Encounter (Signed)
LVM in spanish for pt to call the office and schedule an OV with provider.

## 2017-09-21 NOTE — Telephone Encounter (Signed)
Please call Pt- she is overdue for OV. Please schedule at her earliest convenience. Thank you.

## 2017-10-06 ENCOUNTER — Other Ambulatory Visit: Payer: Self-pay | Admitting: Internal Medicine

## 2017-10-06 ENCOUNTER — Other Ambulatory Visit: Payer: Self-pay | Admitting: Neurology

## 2017-10-31 NOTE — Progress Notes (Deleted)
NEUROLOGY FOLLOW UP OFFICE NOTE  Kimberly Combs [id number]  HISTORY OF PRESENT ILLNESS: Kimberly Combs is a 62 year old right-handed female with type 2 diabetes mellitus, hypothyroidism and hypertension who follows up for restless leg syndrome.  She is accompanied by her husband who supplements history.  ***  UPDATE: 07/07/17 LABS:  CBC with WBC 7.4, HGB 12.2, HCT 36.6, PLT 306; ferritin 49; B12 589 07/14/17:  TSH 4.93.  Current medication:  Gabapentin 300mg  at bedtime.  ***  HISTORY: She reports uncomfortable sensations in her legs at night.  When she lays in bed, she reports a "creepy crawly" sensation with some numbness.  She denies pain or weakness. She denies back pain.  She only gets relief when she moves her legs.  Her husband says she will kick in her sleep as well.  It is not noticeable during the day.  PAST MEDICAL HISTORY: Past Medical History:  Diagnosis Date  . Diabetes mellitus ([place])   . Hypertension   . Thyroid disease     MEDICATIONS: Current Outpatient Medications on File Prior to Visit  Medication Sig Dispense Refill  . acetaminophen (TYLENOL) 500 MG tablet Take 500 mg by mouth every 6 (six) hours as needed.    Marland Kitchen b complex vitamins tablet Take 1 tablet by mouth daily.    Marland Kitchen gabapentin (NEURONTIN) 100 MG capsule TAKE 3 CAPSULES(300 MG) BY MOUTH AT BEDTIME 90 capsule 2  . glucose blood test strip Check blood sugar three times daily Pt using Ultra Mini device 300 each 12  . losartan (COZAAR) 50 MG tablet Take 50 mg by mouth daily.    . metFORMIN (GLUCOPHAGE-XR) 750 MG 24 hr tablet Take 2 tablets (1,500 mg total) by mouth daily with breakfast. 60 tablet 0  . SYNTHROID 125 MCG tablet Take 1 tablet (125 mcg total) by mouth daily before breakfast. 30 tablet 0   Current Facility-Administered Medications on File Prior to Visit  Medication Dose Route Frequency Provider Last Rate Last Dose  . 0.9 %  sodium chloride infusion  500 mL Intravenous Once Jackquline Denmark, MD         ALLERGIES: No Known Allergies  FAMILY HISTORY: Family History  Problem Relation Age of Onset  . Arthritis Mother   . Diabetes Father   . CAD Neg Hx   . Colon cancer Neg Hx   . Breast cancer Neg Hx   . Esophageal cancer Neg Hx   . Stomach cancer Neg Hx    SOCIAL HISTORY: Social History   Socioeconomic History  . Marital status: Married    Spouse name: Ray  . Number of children: 2  . Years of education: Not on file  . Highest education level: Bachelor's degree (e.g., BA, AB, BS)  Occupational History  . Occupation: retired - Education officer, museum , [place]  Social Needs  . Financial resource strain: Not on file  . Food insecurity:    Worry: Not on file    Inability: Not on file  . Transportation needs:    Medical: Not on file    Non-medical: Not on file  Tobacco Use  . Smoking status: Former Smoker    Last attempt to quit: 2000    Years since quitting: 19.7  . Smokeless tobacco: Never Used  . Tobacco comment: light smoker, quit ~ 2000  Substance and Sexual Activity  . Alcohol use: No  . Drug use: Never  . Sexual activity: Not on file  Lifestyle  . Physical activity:  Days per week: Not on file    Minutes per session: Not on file  . Stress: Not on file  Relationships  . Social connections:    Talks on phone: Not on file    Gets together: Not on file    Attends religious service: Not on file    Active member of club or organization: Not on file    Attends meetings of clubs or organizations: Not on file    Relationship status: Not on file  . Intimate partner violence:    Fear of current or ex partner: Not on file    Emotionally abused: Not on file    Physically abused: Not on file    Forced sexual activity: Not on file  Other Topics Concern  . Not on file  Social History Narrative   Born in [place]   Moved from [place] to [place] ~ 2015      Patient is right-handed. She lives with her husband in a 2 story house. She drinks one cup of deca coffee most days. She nad  her husband walk occasionally.    REVIEW OF SYSTEMS: Constitutional: No fevers, chills, or sweats, no generalized fatigue, change in appetite Eyes: No visual changes, double vision, eye pain Ear, nose and throat: No hearing loss, ear pain, nasal congestion, sore throat Cardiovascular: No chest pain, palpitations Respiratory:  No shortness of breath at rest or with exertion, wheezes GastrointestinaI: No nausea, vomiting, diarrhea, abdominal pain, fecal incontinence Genitourinary:  No dysuria, urinary retention or frequency Musculoskeletal:  No neck pain, back pain Integumentary: No rash, pruritus, skin lesions Neurological: as above Psychiatric: No depression, insomnia, anxiety Endocrine: No palpitations, fatigue, diaphoresis, mood swings, change in appetite, change in weight, increased thirst Hematologic/Lymphatic:  No purpura, petechiae. Allergic/Immunologic: no itchy/runny eyes, nasal congestion, recent allergic reactions, rashes  PHYSICAL EXAM: *** General: No acute distress.  Patient appears ***-groomed.  *** body habitus. Head:  Normocephalic/atraumatic Eyes:  Fundi examined but not visualized Neck: supple, no paraspinal tenderness, full range of motion Heart:  Regular rate and rhythm Lungs:  Clear to auscultation bilaterally Back: No paraspinal tenderness Neurological Exam: alert and oriented to person, place, and time. Attention span and concentration intact, recent and remote memory intact, fund of knowledge intact.  Speech fluent and not dysarthric, language intact.  CN II-XII intact. Bulk and tone normal, muscle strength 5/5 throughout.  Sensation to light touch, temperature and vibration intact.  Deep tendon reflexes 2+ throughout, toes downgoing.  Finger to nose and heel to shin testing intact.  Gait normal, Romberg negative.  IMPRESSION: Restless leg syndrome  PLAN: ***  Metta Clines, DO  CC: Kathlene November, MD

## 2017-11-01 ENCOUNTER — Ambulatory Visit: Payer: BLUE CROSS/BLUE SHIELD | Admitting: Neurology

## 2017-11-02 ENCOUNTER — Encounter: Payer: Self-pay | Admitting: Internal Medicine

## 2017-11-02 ENCOUNTER — Ambulatory Visit (INDEPENDENT_AMBULATORY_CARE_PROVIDER_SITE_OTHER): Payer: BLUE CROSS/BLUE SHIELD | Admitting: Internal Medicine

## 2017-11-02 VITALS — BP 132/70 | HR 57 | Temp 97.9°F | Resp 14 | Ht 65.0 in | Wt 196.5 lb

## 2017-11-02 DIAGNOSIS — G2581 Restless legs syndrome: Secondary | ICD-10-CM | POA: Diagnosis not present

## 2017-11-02 DIAGNOSIS — E079 Disorder of thyroid, unspecified: Secondary | ICD-10-CM

## 2017-11-02 DIAGNOSIS — I10 Essential (primary) hypertension: Secondary | ICD-10-CM | POA: Diagnosis not present

## 2017-11-02 DIAGNOSIS — Z23 Encounter for immunization: Secondary | ICD-10-CM

## 2017-11-02 DIAGNOSIS — F419 Anxiety disorder, unspecified: Secondary | ICD-10-CM

## 2017-11-02 DIAGNOSIS — E119 Type 2 diabetes mellitus without complications: Secondary | ICD-10-CM | POA: Diagnosis not present

## 2017-11-02 DIAGNOSIS — K635 Polyp of colon: Secondary | ICD-10-CM

## 2017-11-02 LAB — BASIC METABOLIC PANEL
BUN: 12 mg/dL (ref 6–23)
CHLORIDE: 101 meq/L (ref 96–112)
CO2: 29 meq/L (ref 19–32)
Calcium: 9.6 mg/dL (ref 8.4–10.5)
Creatinine, Ser: 0.72 mg/dL (ref 0.40–1.20)
GFR: 87.16 mL/min (ref >60.00)
Glucose, Bld: 121 mg/dL — ABNORMAL HIGH (ref 70–99)
POTASSIUM: 3.7 meq/L (ref 3.5–5.1)
SODIUM: 139 meq/L (ref 135–145)

## 2017-11-02 LAB — AST: AST: 13 U/L (ref 0–37)

## 2017-11-02 LAB — HEMOGLOBIN A1C: Hgb A1c MFr Bld: 7.2 % — ABNORMAL HIGH (ref 4.6–6.5)

## 2017-11-02 LAB — TSH: TSH: 0.47 u[IU]/mL (ref 0.35–4.50)

## 2017-11-02 LAB — ALT: ALT: 19 U/L (ref 0–35)

## 2017-11-02 MED ORDER — ESCITALOPRAM OXALATE 5 MG PO TABS: 5.0000 mg | ORAL_TABLET | Freq: Every day | ORAL | 3 refills | Status: DC

## 2017-11-02 NOTE — Progress Notes (Signed)
Subjective:    Patient ID: Kimberly Combs, female    DOB: Apr 01, 1955, 62 y.o.   MRN: [id number]  DOS:  11/02/2017 Type of visit - description : f/u Interval history: Since the last office visit, had a colonoscopy, saw ENT, saw neurology: Notes reviewed DM: Meds were adjusted, good compliance Thyroid disease:  Compliant w/ med   Review of Systems Today, she reports some anxiety, related to stress and family issues.  No depression per se. Her sleep is not as good as usual. Medication?  Past Medical History:  Diagnosis Date  . Diabetes mellitus ([place])   . Hypertension   . Thyroid disease     Past Surgical History:  Procedure Laterality Date  . CESAREAN SECTION  [id number]   X2  . COLONOSCOPY  10 years ago    in [place]="normal exam" per pt    Social History   Socioeconomic History  . Marital status: Married    Spouse name: Ray  . Number of children: 2  . Years of education: Not on file  . Highest education level: Bachelor's degree (e.g., BA, AB, BS)  Occupational History  . Occupation: retired - Education officer, museum , [place]  Social Needs  . Financial resource strain: Not on file  . Food insecurity:    Worry: Not on file    Inability: Not on file  . Transportation needs:    Medical: Not on file    Non-medical: Not on file  Tobacco Use  . Smoking status: Former Smoker    Last attempt to quit: 2000    Years since quitting: 19.7  . Smokeless tobacco: Never Used  . Tobacco comment: light smoker, quit ~ 2000  Substance and Sexual Activity  . Alcohol use: No  . Drug use: Never  . Sexual activity: Not on file  Lifestyle  . Physical activity:    Days per week: Not on file    Minutes per session: Not on file  . Stress: Not on file  Relationships  . Social connections:    Talks on phone: Not on file    Gets together: Not on file    Attends religious service: Not on file    Active member of club or organization: Not on file    Attends meetings of clubs or organizations:  Not on file    Relationship status: Not on file  . Intimate partner violence:    Fear of current or ex partner: Not on file    Emotionally abused: Not on file    Physically abused: Not on file    Forced sexual activity: Not on file  Other Topics Concern  . Not on file  Social History Narrative   Born in [place]   Moved from [place] to [place] ~ 2015      Patient is right-handed. She lives with her husband in a 2 story house. She drinks one cup of deca coffee most days. She nad her husband walk occasionally.      Allergies as of 11/02/2017   No Known Allergies     Medication List        Accurate as of 11/02/17 11:59 PM. Always use your most recent med list.          acetaminophen 500 MG tablet Commonly known as:  TYLENOL Take 500 mg by mouth every 6 (six) hours as needed.   b complex vitamins tablet Take 1 tablet by mouth daily.   escitalopram 5 MG tablet Commonly known as:  LEXAPRO Take 1 tablet (5 mg total) by mouth daily.   gabapentin 100 MG capsule Commonly known as:  NEURONTIN TAKE 3 CAPSULES(300 MG) BY MOUTH AT BEDTIME   glucose blood test strip Check blood sugar three times daily Pt using Ultra Mini device   losartan 50 MG tablet Commonly known as:  COZAAR Take 50 mg by mouth daily.   metFORMIN 750 MG 24 hr tablet Commonly known as:  GLUCOPHAGE-XR Take 2 tablets (1,500 mg total) by mouth daily with breakfast.   SYNTHROID 125 MCG tablet Generic drug:  levothyroxine Take 1 tablet (125 mcg total) by mouth daily before breakfast.          Objective:   Physical Exam BP 132/70 (BP Location: Left Arm, Patient Position: Sitting, Cuff Size: Normal)   Pulse (!) 57   Temp 97.9 F (36.6 C) (Oral)   Resp 14   Ht 5\' 5"  (1.651 m)   Wt 196 lb 8 oz (89.1 kg)   SpO2 97%   BMI 32.70 kg/m   General:   Well developed, NAD, see BMI.  HEENT:  Normocephalic . Face symmetric, atraumatic Lungs:  CTA B Normal respiratory effort, no intercostal retractions, no accessory  muscle use. Heart: RRR,  no murmur.  No pretibial edema bilaterally  Skin: Not pale. Not jaundice DIABETIC FEET EXAM: No lower extremity edema Normal pedal pulses bilaterally Skin normal, nails normal, no calluses Pinprick examination of the feet normal. Neurologic:  alert & oriented X3.  Speech normal, gait appropriate for age and unassisted Psych--  Cognition and judgment appear intact.  Cooperative with normal attention span and concentration.  Behavior appropriate. No anxious or depressed appearing.      Assessment & Plan:   Assessment (this is new patient 05/2017, used to see Dr. Delfina Redwood, this office is more conveniently located) DM: dx ~ 2014 HTN: dx ~ 2016 RLS, periodic limb movement per neuro 2019 Hypothyroidism dx remotely   Plan: DM: Last A1c was 7.4, metformin increase to XR 750 mg 2 tablets daily.  Good compliance, no apparent side effects, ambulatory CBGs have not changed much (120s).  Check a A1c, BMP, LFTs. RLS: Saw neurology 07/2017, impression was RLS, periodic limb movements of sleep.  Recommended gabapentin, ferritin and B12 unremarkable.  Symptoms are about the same, plans to discuss with neurology. Hypothyroidism: Since the last visit, TSH was elevated, Synthroid medication has been adjusted twice.  Check a TSH Tinnitus saw ENT, impression was minimal presbycusis with tinnitus. No specific Rx.  Patient is counseled.  May try to mask symptoms with very low-grade background music Colon polyps had a colonoscopy 07/2017, 3 adenomatous polyps, rec redo cscope in 3 years, pt quit skeptical, results discussed, risk of future polyps turning into something serious like cancer discussed. Anxiety: As described above, no depression.  Treatment options include counseling, increase exercise and medication.  Patient request medication.  Will start very low-dose of Lexapro 5 mg, watch for side effects, follow-up in 3 months however if is not working in the next 2 to 3 weeks she  might call to increase dosing. MSK: Reports occasional aches and pains at the hips, buttocks, shoulders, typically "related to the weather".  Denies fever, chills, weight loss.  Recommend observation. RTC 3 months  Today, I spent more than 25   min with the patient: >50% of the time counseling regards anxiety management, colon polyps possible complications, reviewing the chart.  Also managing chronic issues.

## 2017-11-02 NOTE — Patient Instructions (Addendum)
GO TO THE LAB : Get the blood work     GO TO THE FRONT DESK Schedule your next appointment for a checkup in 3 months  Start taking escitalopram (Lexapro) 5 mg: 1 tablet daily  Printing will not you complete

## 2017-11-02 NOTE — Progress Notes (Signed)
Pre visit review using our clinic review tool, if applicable. No additional management support is needed unless otherwise documented below in the visit note. 

## 2017-11-03 ENCOUNTER — Other Ambulatory Visit: Payer: Self-pay | Admitting: Internal Medicine

## 2017-11-03 DIAGNOSIS — F419 Anxiety disorder, unspecified: Secondary | ICD-10-CM | POA: Insufficient documentation

## 2017-11-03 DIAGNOSIS — G2581 Restless legs syndrome: Secondary | ICD-10-CM | POA: Insufficient documentation

## 2017-11-03 DIAGNOSIS — K635 Polyp of colon: Secondary | ICD-10-CM | POA: Insufficient documentation

## 2017-11-03 NOTE — Assessment & Plan Note (Signed)
DM: Last A1c was 7.4, metformin increase to XR 750 mg 2 tablets daily.  Good compliance, no apparent side effects, ambulatory CBGs have not changed much (120s).  Check a A1c, BMP, LFTs. RLS: Saw neurology 07/2017, impression was RLS, periodic limb movements of sleep.  Recommended gabapentin, ferritin and B12 unremarkable.  Symptoms are about the same, plans to discuss with neurology. Hypothyroidism: Since the last visit, TSH was elevated, Synthroid medication has been adjusted twice.  Check a TSH Tinnitus saw ENT, impression was minimal presbycusis with tinnitus. No specific Rx.  Patient is counseled.  May try to mask symptoms with very low-grade background music Colon polyps had a colonoscopy 07/2017, 3 adenomatous polyps, rec redo cscope in 3 years, pt quit skeptical, results discussed, risk of future polyps turning into something serious like cancer discussed. Anxiety: As described above, no depression.  Treatment options include counseling, increase exercise and medication.  Patient request medication.  Will start very low-dose of Lexapro 5 mg, watch for side effects, follow-up in 3 months however if is not working in the next 2 to 3 weeks she might call to increase dosing. MSK: Reports occasional aches and pains at the hips, buttocks, shoulders, typically "related to the weather".  Denies fever, chills, weight loss.  Recommend observation. RTC 3 month

## 2017-11-03 NOTE — Telephone Encounter (Signed)
Pt requesting refill on metformin xr 750mg . A1c completed yesterday. Okay to refill?

## 2017-11-04 NOTE — Telephone Encounter (Signed)
Rx sent 

## 2017-11-04 NOTE — Telephone Encounter (Signed)
See lab results.  Needs Farxiga and  increase metformin XR 750 mg to 2 in the morning and 1 with dinner.

## 2017-11-07 ENCOUNTER — Telehealth: Payer: Self-pay | Admitting: Internal Medicine

## 2017-11-07 NOTE — Telephone Encounter (Signed)
Dosage change was sent on 11/04/2017.

## 2017-11-07 NOTE — Telephone Encounter (Signed)
Pt called in and was given message from Dr. Larose Kells regarding lab results dated 11/04/17 at 12:59PM and the addendum from 11/04/17 at 1:01 PM to increase metformin XR 750 mg to 2 in the morning and 1 with dinner.    She verbalized understanding of this medication change.   Also verbalized understanding to notify us if she develops a vaginal discharge or rash.  She has a BP machine so will monitor her BP to make sure it does not get too low.  She has a 3 month follow up scheduled in January 2020.  No Result Note was made.  Routed this message to Dr. Larose Kells so he would know she got the message and can send in the Rx for the metformin dose change.

## 2017-11-11 ENCOUNTER — Telehealth: Payer: Self-pay | Admitting: Internal Medicine

## 2017-11-11 MED ORDER — FLUOXETINE HCL 20 MG PO TABS: 20.0000 mg | ORAL_TABLET | Freq: Every day | ORAL | 1 refills | Status: DC

## 2017-11-11 NOTE — Telephone Encounter (Signed)
Please advise 

## 2017-11-11 NOTE — Telephone Encounter (Signed)
I meant to say she needs a OV in 4 weeks.  Please arrange

## 2017-11-11 NOTE — Telephone Encounter (Signed)
Dr. Larose Kells- what labs was Pt needing in 4 weeks? None pending.

## 2017-11-11 NOTE — Telephone Encounter (Signed)
Pt given information related to medication change per Dr Larose Kells; she verbalizes understanding; pt also scheduled lab appointment 12/09/17 at [place].

## 2017-11-11 NOTE — Telephone Encounter (Signed)
Copied from [place] [phone] [id number]. Topic: Quick Communication - Rx Refill/Question >> Nov 11, 2017  1:26 PM Gardiner Ramus wrote: Medication: escitalopram (LEXAPRO) 5 MG tablet [[id number]]  pt called and stated that she has been having issues with this medication. She has had stomach issues and feeling light headed with headaches. Please advise [phone]

## 2017-11-11 NOTE — Telephone Encounter (Signed)
Tried calling Pt- no answer, unable to leave message. Okay for PEC to discuss. Fluoxetine 20mg  sent to pharmacy.

## 2017-11-11 NOTE — Telephone Encounter (Addendum)
Advised patient: Stop Lexapro Try fluoxetine 20 mg tablets: Half tablet daily for 2 weeks, then 1 tablet daily.  #30, 1 refill. Schedule  a OV in 4 weeks from now

## 2017-11-14 NOTE — Telephone Encounter (Signed)
Can you call Pt-she didn't need lab appt in 4 weeks just an office visit w/ PCP.

## 2017-11-15 NOTE — Telephone Encounter (Signed)
Called pt an schedule her for fu appt on Nov 30, 2017. Done.

## 2017-11-30 ENCOUNTER — Ambulatory Visit (INDEPENDENT_AMBULATORY_CARE_PROVIDER_SITE_OTHER): Payer: BLUE CROSS/BLUE SHIELD | Admitting: Internal Medicine

## 2017-11-30 ENCOUNTER — Encounter: Payer: Self-pay | Admitting: Internal Medicine

## 2017-11-30 VITALS — BP 132/84 | HR 64 | Temp 98.0°F | Resp 16 | Ht 65.0 in | Wt 193.2 lb

## 2017-11-30 DIAGNOSIS — E119 Type 2 diabetes mellitus without complications: Secondary | ICD-10-CM

## 2017-11-30 DIAGNOSIS — F419 Anxiety disorder, unspecified: Secondary | ICD-10-CM

## 2017-11-30 DIAGNOSIS — G4700 Insomnia, unspecified: Secondary | ICD-10-CM

## 2017-11-30 MED ORDER — ZOLPIDEM TARTRATE 5 MG PO TABS: 5.0000 mg | ORAL_TABLET | Freq: Every evening | ORAL | 1 refills | Status: DC | PRN

## 2017-11-30 MED ORDER — ONETOUCH VERIO FLEX SYSTEM W/DEVICE KIT: PACK | 0 refills | Status: AC

## 2017-11-30 MED ORDER — ONETOUCH LANCETS MISC: 12 refills | Status: AC

## 2017-11-30 MED ORDER — GLUCOSE BLOOD VI STRP: ORAL_STRIP | 12 refills | Status: DC

## 2017-11-30 NOTE — Progress Notes (Signed)
Pre visit review using our clinic review tool, if applicable. No additional management support is needed unless otherwise documented below in the visit note. 

## 2017-11-30 NOTE — Patient Instructions (Signed)
  GO TO THE FRONT DESK Schedule your next appointment for a physical exam fasting in 3 months   take Ambien 1 tablet at bedtime.

## 2017-11-30 NOTE — Progress Notes (Signed)
Subjective:    Patient ID: Kimberly Combs, female    DOB: 07/30/1955, 62 y.o.   MRN: [id number]  DOS:  11/30/2017 Type of visit - description : f/u  Interval history: DM: Good compliance with metformin, I prescribed farxiga but the prescription was not sent.  She is doing better with diet, has noted some weight loss. Anxiety: Not taking any medication at this point, see assessment and plan Insomnia: Would like an Ambien prescription.  Wt Readings from Last 3 Encounters:  11/30/17 193 lb 4 oz (87.7 kg)  11/02/17 196 lb 8 oz (89.1 kg)  07/07/17 199 lb (90.3 kg)     Review of Systems Denies chest pain no difficulty breathing No nausea or vomiting.  Past Medical History:  Diagnosis Date  . Diabetes mellitus ([place])   . Hypertension   . Thyroid disease     Past Surgical History:  Procedure Laterality Date  . CESAREAN SECTION  2836,6294   X2  . COLONOSCOPY  10 years ago    in [place]="normal exam" per pt    Social History   Socioeconomic History  . Marital status: Married    Spouse name: Ray  . Number of children: 2  . Years of education: Not on file  . Highest education level: Bachelor's degree (e.g., BA, AB, BS)  Occupational History  . Occupation: retired - Education officer, museum , [place]  Social Needs  . Financial resource strain: Not on file  . Food insecurity:    Worry: Not on file    Inability: Not on file  . Transportation needs:    Medical: Not on file    Non-medical: Not on file  Tobacco Use  . Smoking status: Former Smoker    Last attempt to quit: 2000    Years since quitting: 19.8  . Smokeless tobacco: Never Used  . Tobacco comment: light smoker, quit ~ 2000  Substance and Sexual Activity  . Alcohol use: No  . Drug use: Never  . Sexual activity: Not on file  Lifestyle  . Physical activity:    Days per week: Not on file    Minutes per session: Not on file  . Stress: Not on file  Relationships  . Social connections:    Talks on phone: Not on file   Gets together: Not on file    Attends religious service: Not on file    Active member of club or organization: Not on file    Attends meetings of clubs or organizations: Not on file    Relationship status: Not on file  . Intimate partner violence:    Fear of current or ex partner: Not on file    Emotionally abused: Not on file    Physically abused: Not on file    Forced sexual activity: Not on file  Other Topics Concern  . Not on file  Social History Narrative   Born in [place]   Moved from [place] to [place] ~ 2015      Patient is right-handed. She lives with her husband in a 2 story house. She drinks one cup of deca coffee most days. She nad her husband walk occasionally.      Allergies as of 11/30/2017   No Known Allergies     Medication List        Accurate as of 11/30/17 11:59 PM. Always use your most recent med list.          acetaminophen 500 MG tablet Commonly known as:  TYLENOL  Take 500 mg by mouth every 6 (six) hours as needed.   b complex vitamins tablet Take 1 tablet by mouth daily.   gabapentin 100 MG capsule Commonly known as:  NEURONTIN TAKE 3 CAPSULES(300 MG) BY MOUTH AT BEDTIME   glucose blood test strip Check blood sugar three times daily   losartan 50 MG tablet Commonly known as:  COZAAR Take 50 mg by mouth daily.   metFORMIN 750 MG 24 hr tablet Commonly known as:  GLUCOPHAGE-XR Take 2 tablets by mouth every morning and 1 tablet by mouth every evening   ONE TOUCH LANCETS Misc Check blood sugar three times daily   ONETOUCH VERIO FLEX SYSTEM w/Device Kit Check blood sugar 3 times daily   SYNTHROID 125 MCG tablet Generic drug:  levothyroxine Take 1 tablet (125 mcg total) by mouth daily before breakfast.   zolpidem 5 MG tablet Commonly known as:  AMBIEN Take 1 tablet (5 mg total) by mouth at bedtime as needed for sleep.          Objective:   Physical Exam BP 132/84 (BP Location: Left Arm, Patient Position: Sitting, Cuff Size: Normal)    Pulse 64   Temp 98 F (36.7 C) (Oral)   Resp 16   Ht '5\' 5"'  (1.651 m)   Wt 193 lb 4 oz (87.7 kg)   SpO2 97%   BMI 32.16 kg/m  General:   Well developed, NAD, see BMI.  HEENT:  Normocephalic . Face symmetric, atraumatic Lungs:  CTA B Normal respiratory effort, no intercostal retractions, no accessory muscle use. Heart: RRR,  no murmur.  No pretibial edema bilaterally  Skin: Not pale. Not jaundice Neurologic:  alert & oriented X3.  Speech normal, gait appropriate for age and unassisted Psych--  Cognition and judgment appear intact.  Cooperative with normal attention span and concentration.  Behavior appropriate. No anxious or depressed appearing.      Assessment & Plan:   Assessment (this is new patient 05/2017, used to see Dr. Delfina Redwood, this office is more conveniently located) DM: dx ~ 2014 HTN: dx ~ 2016 Anxiety, insomnia RLS, periodic limb movement per neuro 2019 Hypothyroidism dx remotely   Plan: DM: Last A1c 7.2, I recommend to increase metformin XR 750 to 3 tablets daily and start farxiga but the RX was not sent. Good compliance w/ metformin, improving diet, has lost some weight.  For now will recommend to stay on metformin, new glucometer sent.  Come back in 3 months. Anxiety: Started Lexapro, caused headaches, tried fluoxetine for 2 days only and self stopped due to dry mouth.  At this point she does not like to pursue other SSRIs (we could re-try fluoxetine in the future) .  She requests insomnia management, she feels that if she can sleep well, anxiety will decrease.  See next Insomnia: On and off problem, previously Ambien worked really well for her, I do not see a contraindication, will start Ambien 5 mg.  Call if needs  a slightly stronger dose. RTC 3 months CPX

## 2017-12-01 DIAGNOSIS — G4700 Insomnia, unspecified: Secondary | ICD-10-CM | POA: Insufficient documentation

## 2017-12-01 NOTE — Assessment & Plan Note (Signed)
Plan: DM: Last A1c 7.2, I recommend to increase metformin XR 750 to 3 tablets daily and start farxiga but the RX was not sent. Good compliance w/ metformin, improving diet, has lost some weight.  For now will recommend to stay on metformin, new glucometer sent.  Come back in 3 months. Anxiety: Started Lexapro, caused headaches, tried fluoxetine for 2 days only and self stopped due to dry mouth.  At this point she does not like to pursue other SSRIs (we could re-try fluoxetine in the future) .  She requests insomnia management, she feels that if she can sleep well, anxiety will decrease.  See next Insomnia: On and off problem, previously Ambien worked really well for her, I do not see a contraindication, will start Ambien 5 mg.  Call if needs  a slightly stronger dose. RTC 3 months CPX

## 2017-12-09 ENCOUNTER — Other Ambulatory Visit: Payer: BLUE CROSS/BLUE SHIELD

## 2018-01-04 DIAGNOSIS — H2513 Age-related nuclear cataract, bilateral: Secondary | ICD-10-CM | POA: Diagnosis not present

## 2018-01-04 DIAGNOSIS — E119 Type 2 diabetes mellitus without complications: Secondary | ICD-10-CM | POA: Diagnosis not present

## 2018-01-04 DIAGNOSIS — Z7984 Long term (current) use of oral hypoglycemic drugs: Secondary | ICD-10-CM | POA: Diagnosis not present

## 2018-01-04 DIAGNOSIS — H18413 Arcus senilis, bilateral: Secondary | ICD-10-CM | POA: Diagnosis not present

## 2018-01-04 LAB — HM DIABETES EYE EXAM

## 2018-01-09 ENCOUNTER — Encounter: Payer: Self-pay | Admitting: Internal Medicine

## 2018-02-07 ENCOUNTER — Ambulatory Visit: Payer: BLUE CROSS/BLUE SHIELD | Admitting: Internal Medicine

## 2018-03-06 ENCOUNTER — Encounter: Payer: Self-pay | Admitting: Internal Medicine

## 2018-03-07 ENCOUNTER — Encounter: Payer: Self-pay | Admitting: Internal Medicine

## 2018-03-07 ENCOUNTER — Ambulatory Visit (INDEPENDENT_AMBULATORY_CARE_PROVIDER_SITE_OTHER): Payer: BLUE CROSS/BLUE SHIELD | Admitting: Internal Medicine

## 2018-03-07 VITALS — BP 122/74 | HR 69 | Temp 98.2°F | Resp 16 | Ht 65.0 in | Wt 193.1 lb

## 2018-03-07 DIAGNOSIS — G4700 Insomnia, unspecified: Secondary | ICD-10-CM | POA: Diagnosis not present

## 2018-03-07 DIAGNOSIS — M5441 Lumbago with sciatica, right side: Secondary | ICD-10-CM | POA: Diagnosis not present

## 2018-03-07 DIAGNOSIS — E119 Type 2 diabetes mellitus without complications: Secondary | ICD-10-CM

## 2018-03-07 LAB — HEMOGLOBIN A1C: Hgb A1c MFr Bld: 6.7 % — ABNORMAL HIGH (ref 4.6–6.5)

## 2018-03-07 MED ORDER — PREDNISONE 10 MG PO TABS: ORAL_TABLET | ORAL | 0 refills | Status: DC

## 2018-03-07 NOTE — Progress Notes (Signed)
Pre visit review using our clinic review tool, if applicable. No additional management support is needed unless otherwise documented below in the visit note. 

## 2018-03-07 NOTE — Assessment & Plan Note (Signed)
DM: Currently on metformin, reports good compliance, checking A1c.  Ambulatory CBGs in the 130s. Anxiety: Essentially resolved Insomnia: On Ambien, contract today. Back pain: new problem, pain located at the buttock, + radiculopathy features but also trochanteric bursitis type of symptoms.  Neurological exam is normal.  Recommend a low-dose prednisone for few days, stop if CBGs more than 180.  If not improving will refer to Ortho.  See AVS. Preventive care reviewed : Td 2017.  PNM 23: 2019.  Had a flu shot. CCS: Colonoscopy in [place] around 2009, colonoscopy in [facility] 07/04/2017 Female care: [facility] 06-2017.  Pap/HPV 08/03/2017 per [facility] RTC 4 months

## 2018-03-07 NOTE — Patient Instructions (Signed)
GO TO THE LAB : Get the blood work     GO TO THE FRONT DESK Schedule your next appointment checkup in 4 months, fasting  For pain:  Ice to the right hip every night  Take prednisone as prescribed, it will raise your blood sugar, if he goes more than 180 please to stop  Okay to take Tylenol  Also, okay to take IBUPROFEN (Advil or Motrin) 200 mg 2 tablets every 8 hours as needed for pain.  Always take it with food because may cause gastritis and ulcers.  If you notice nausea, stomach pain, change in the color of stools --->  Stop the medicine and let us know  Call if not gradually better in the next 2 weeks.

## 2018-03-07 NOTE — Progress Notes (Signed)
Subjective:    Patient ID: Kimberly Combs, female    DOB: 22-Jun-1955, 63 y.o.   MRN: [id number]  DOS:  03/07/2018 Type of visit - description: Routine office visit We discussed chronic issues. Anxiety: Resolved Insomnia: Controlled with Ambien 4 weeks ago developed a steady pain from the right buttock radiating down to the leg up to the plantar area of the right foot. Symptoms actually are better when she moves around and worse with rest. Tylenol does not help. She is also tender at the trochanteric bursa. Recalls no injury. DM: Good compliance with metformin, ambulatory CBGs in the 130s.    Review of Systems  Denies fever, chills. No injury or fall Admits to paresthesias, right leg: from time to time the right thigh feels heavy, "like a rock".  No motor deficits.  Past Medical History:  Diagnosis Date  . Diabetes mellitus ([place])   . Hypertension   . Thyroid disease     Past Surgical History:  Procedure Laterality Date  . CESAREAN SECTION  5621,3086   X2  . COLONOSCOPY  10 years ago    in [place]="normal exam" per pt    Social History   Socioeconomic History  . Marital status: Married    Spouse name: Ray  . Number of children: 2  . Years of education: Not on file  . Highest education level: Bachelor's degree (e.g., BA, AB, BS)  Occupational History  . Occupation: retired - Education officer, museum , [place]  Social Needs  . Financial resource strain: Not on file  . Food insecurity:    Worry: Not on file    Inability: Not on file  . Transportation needs:    Medical: Not on file    Non-medical: Not on file  Tobacco Use  . Smoking status: Former Smoker    Last attempt to quit: 2000    Years since quitting: 20.1  . Smokeless tobacco: Never Used  . Tobacco comment: light smoker, quit ~ 2000  Substance and Sexual Activity  . Alcohol use: No  . Drug use: Never  . Sexual activity: Not on file  Lifestyle  . Physical activity:    Days per week: Not on file    Minutes per  session: Not on file  . Stress: Not on file  Relationships  . Social connections:    Talks on phone: Not on file    Gets together: Not on file    Attends religious service: Not on file    Active member of club or organization: Not on file    Attends meetings of clubs or organizations: Not on file    Relationship status: Not on file  . Intimate partner violence:    Fear of current or ex partner: Not on file    Emotionally abused: Not on file    Physically abused: Not on file    Forced sexual activity: Not on file  Other Topics Concern  . Not on file  Social History Narrative   Born in [place]   Moved from [place] to [place] ~ 2015      Patient is right-handed. She lives with her husband in a 2 story house. She drinks one cup of deca coffee most days. She nad her husband walk occasionally.      Allergies as of 03/07/2018   No Known Allergies     Medication List       Accurate as of March 07, 2018  5:52 PM. Always use your most recent med  list.        acetaminophen 500 MG tablet Commonly known as:  TYLENOL Take 500 mg by mouth every 6 (six) hours as needed.   b complex vitamins tablet Take 1 tablet by mouth daily.   gabapentin 100 MG capsule Commonly known as:  NEURONTIN TAKE 3 CAPSULES(300 MG) BY MOUTH AT BEDTIME   glucose blood test strip Commonly known as:  ONETOUCH VERIO Check blood sugar three times daily   losartan 50 MG tablet Commonly known as:  COZAAR Take 50 mg by mouth daily.   metFORMIN 750 MG 24 hr tablet Commonly known as:  GLUCOPHAGE-XR Take 2 tablets by mouth every morning and 1 tablet by mouth every evening   ONE TOUCH LANCETS Misc Check blood sugar three times daily   ONETOUCH VERIO FLEX SYSTEM w/Device Kit Check blood sugar 3 times daily   predniSONE 10 MG tablet Commonly known as:  DELTASONE 3 tabs x 2 days, 2 tabs x 2 days, 1 tab x 2 days   SYNTHROID 125 MCG tablet Generic drug:  levothyroxine Take 1 tablet (125 mcg total) by mouth daily  before breakfast.   zolpidem 5 MG tablet Commonly known as:  AMBIEN Take 1 tablet (5 mg total) by mouth at bedtime as needed for sleep.           Objective:   Physical Exam BP 122/74 (BP Location: Right Arm, Patient Position: Sitting, Cuff Size: Small)   Pulse 69   Temp 98.2 F (36.8 C) (Oral)   Resp 16   Ht _0  (1.651 m)   Wt 193 lb 2 oz (87.6 kg)   SpO2 97%   BMI 32.14 kg/m  General:   Well developed, NAD, BMI noted. HEENT:  Normocephalic . Face symmetric, atraumatic Lungs:  CTA B Normal respiratory effort, no intercostal retractions, no accessory muscle use. Heart: RRR,  no murmur.  No pretibial edema bilaterally  Skin: Not pale. Not jaundice MSK: No TTP at the low back. Slightly TTP at the right trochanteric bursa Neurologic:  alert & oriented X3.  Speech normal, gait appropriate for age and unassisted DTRs symmetric.  Straight leg test negative.  Motor normal. Psych--  Cognition and judgment appear intact.  Cooperative with normal attention span and concentration.  Behavior appropriate. No anxious or depressed appearing.      Assessment     Assessment (this is new patient 05/2017, used to see Dr. Delfina Redwood, this office is more conveniently located) DM: dx ~ 2014 HTN: dx ~ 2016 Anxiety, insomnia RLS, periodic limb movement per neuro 2019 Hypothyroidism dx remotely   Plan: DM: Currently on metformin, reports good compliance, checking A1c.  Ambulatory CBGs in the 130s. Anxiety: Essentially resolved Insomnia: On Ambien, contract today. Back pain: new problem, pain located at the buttock, + radiculopathy features but also trochanteric bursitis type of symptoms.  Neurological exam is normal.  Recommend a low-dose prednisone for few days, stop if CBGs more than 180.  If not improving will refer to Ortho.  See AVS. Preventive care reviewed : Td 2017.  PNM 23: 2019.  Had a flu shot. CCS: Colonoscopy in [place] around 2009, colonoscopy in [facility]  07/04/2017 Female care: [facility] 06-2017.  Pap/HPV 08/03/2017 per [facility] RTC 4 months

## 2018-03-08 ENCOUNTER — Other Ambulatory Visit: Payer: Self-pay | Admitting: Internal Medicine

## 2018-03-10 ENCOUNTER — Telehealth: Payer: Self-pay

## 2018-03-10 NOTE — Telephone Encounter (Signed)
Copied from [place] [id number]. Topic: General - Inquiry >> Mar 10, 2018 12:33 PM Conception Chancy, NT wrote: Reason for CRM: patient is calling and would like recent lab results mailed to her

## 2018-03-13 NOTE — Telephone Encounter (Signed)
Notes recorded by Damita Dunnings, CMA on 03/07/2018 at 1:22 PM EST Results mailed. ------  Notes recorded by Colon Branch, MD on 03/07/2018 at 12:47 PM EST Send a letter Your diabetes has improved, the A1c is now 6.7, good results, continue the same medications.

## 2018-03-22 ENCOUNTER — Telehealth: Payer: Self-pay

## 2018-03-22 DIAGNOSIS — M549 Dorsalgia, unspecified: Secondary | ICD-10-CM

## 2018-03-22 DIAGNOSIS — M7060 Trochanteric bursitis, unspecified hip: Secondary | ICD-10-CM

## 2018-03-22 NOTE — Telephone Encounter (Signed)
Please advise 

## 2018-03-22 NOTE — Telephone Encounter (Signed)
Copied from [place] [phone]. Topic: Referral - Request for Referral >> Mar 22, 2018  1:02 PM Lennox Solders wrote: Has patient seen PCP for this complaint? Yes. Pt would like to proceed with getting ortho referral for right leg pain and right hip pain

## 2018-03-22 NOTE — Telephone Encounter (Signed)
Patient seen recently with back pain, question of trochanteric bursitis. Please arrange a orthopedic referral.  DX back pain, trochanteric bursitis

## 2018-03-22 NOTE — Telephone Encounter (Signed)
Referral placed.

## 2018-03-28 ENCOUNTER — Encounter (INDEPENDENT_AMBULATORY_CARE_PROVIDER_SITE_OTHER): Payer: Self-pay | Admitting: Family Medicine

## 2018-03-28 ENCOUNTER — Ambulatory Visit (INDEPENDENT_AMBULATORY_CARE_PROVIDER_SITE_OTHER): Payer: BLUE CROSS/BLUE SHIELD | Admitting: Family Medicine

## 2018-03-28 DIAGNOSIS — M25551 Pain in right hip: Secondary | ICD-10-CM

## 2018-03-28 MED ORDER — DICLOFENAC SODIUM 75 MG PO TBEC: 75.0000 mg | DELAYED_RELEASE_TABLET | Freq: Two times a day (BID) | ORAL | 3 refills | Status: DC | PRN

## 2018-03-28 NOTE — Progress Notes (Signed)
I saw and examined the patient with Dr. Okey Dupre and agree with assessment and plan as outlined.  Probable right greater trochanter syndrome, cannot rule out lumbar foraminal stenosis.  Trial of PT.  X-Rays, possibly lumbar MRI if pain persists.

## 2018-03-28 NOTE — Progress Notes (Signed)
  Kimberly Combs - 63 y.o. female MRN [id number]  Date of birth: February 24, 1955    SUBJECTIVE:      Chief Complaint: Right hip pain  HPI:  63 y/o female with lateral right hip pain for about 2 months.  Pain is localized to the lateral hip.  It radiates down the side of her leg.  She denies any groin pain.  She does note occasional tingling sensation in the posterior leg.  No symptoms into the foot.  She denies any prior injuries.  With prolonged weakness she does at times feel some weakness in the leg.  She denies any regular back pain. No bruising or erythema   ROS:     See HPI  PERTINENT  PMH / PSH FH / / SH:  Past Medical, Surgical, Social, and Family History Reviewed & Updated in the EMR.   - Diabetes  OBJECTIVE: There were no vitals taken for this visit.  Physical Exam:  Vital signs are reviewed.  GEN: Alert and oriented, NAD Pulm: Breathing unlabored PSY: normal mood, congruent affect  MSK: Right Hip:  - Inspection: No gross deformity, no swelling, erythema, or ecchymosis - Palpation: TTP over greater trochanter and along IT band - ROM: Normal range of motion on Flexion, extension, abduction, internal and external rotation - Strength: 5/5 strength with hip flexion, 4+/5 with abduction with pain - Neuro/vasc: NV intact distally - Special Tests: Negative FABER and FADIR.  Negative logroll.    ASSESSMENT & PLAN:  1. Right hip pain - 2/2 GT pain syndrome vs glute med tendindopathy - will send to physical therapy - diclofenac - f/u 6 weeks if no improvememt

## 2018-04-07 ENCOUNTER — Ambulatory Visit: Payer: BLUE CROSS/BLUE SHIELD | Attending: Family Medicine | Admitting: Physical Therapy

## 2018-04-07 ENCOUNTER — Other Ambulatory Visit: Payer: Self-pay

## 2018-04-07 DIAGNOSIS — M25551 Pain in right hip: Secondary | ICD-10-CM | POA: Diagnosis not present

## 2018-04-07 DIAGNOSIS — M25651 Stiffness of right hip, not elsewhere classified: Secondary | ICD-10-CM | POA: Diagnosis not present

## 2018-04-07 NOTE — Patient Instructions (Signed)
  Hamstring Stretch, Reclined (Strap, Doorframe)   Lengthen bottom leg on floor. Extend top leg along edge of doorframe or press foot up into yoga strap. Hold for 30-60 seconds. Repeat 3_ times each leg.   Outer Hip Stretch: Reclined IT Band Stretch (Strap)   Strap around opposite foot, pull across only as far as possible with shoulders on mat. Hold for __30-60 __ seconds. Repeat __3__ times each leg. 2-3 x/day.   Piriformis (Supine)  Cross legs, right on top. Gently pull other knee toward chest until stretch is felt in buttock/hip of top leg. Hold _30-60___ seconds. Can place bottom foot on wall.  Repeat _3___ times per set. Do ____ sets per session. Do __2__ sessions per day.  or  Hip Stretch  Put right ankle over left knee. Let right knee fall downward, but keep ankle in place. Feel the stretch in hip. May push down gently with hand to feel stretch. Hold __60__ seconds while counting out loud. Repeat with other leg. Repeat __3__ times. Do _2___ sessions per day.    Quadriceps (Prone) Do one leg at a time.   On stomach with sheet around ankles, knees together, hips down, pull heels toward bottom. Keep hips flat. Hold __30-60__ seconds. Repeat _3__ times. Do __3__ sessions per day. CAUTION: Stretch should be gentle, steady and slow.  Copyright  VHI. All rights reserved.    Achilles Tendon Stretch   Stand with hands supported on wall, elbows slightly bent, feet parallel and both heels on floor, front knee bent, back knee straight. Slowly relax back knee until a stretch is felt in achilles tendon. Hold __30-60_ seconds. Repeat with leg positions switched. Repeat with back knee slightly bent.  Madelyn Flavors, PT 04/07/18 10:13 AM [address] [place], [place], [address] Phone: [phone]   Fax:  [phone]

## 2018-04-07 NOTE — Therapy (Signed)
[address] [place], [place], [address] Phone: [phone]   Fax:  [phone]  Physical Therapy Evaluation  Patient Details  Name: Kimberly Combs MRN: [id number] Date of Birth: [birth year] Referring Provider (PT): Eunice Blase   Encounter Date: 04/07/2018  PT End of Session - 04/07/18 0937    Visit Number  1    Date for PT Re-Evaluation  05/05/18    PT Start Time  0937    PT Stop Time  1015    PT Time Calculation (min)  38 min    Activity Tolerance  Patient tolerated treatment well    Behavior During Therapy  [facility] for tasks assessed/performed       Past Medical History:  Diagnosis Date  . Diabetes mellitus ([place])   . Hypertension   . Thyroid disease     Past Surgical History:  Procedure Laterality Date  . CESAREAN SECTION  5056,9794   X2  . COLONOSCOPY  10 years ago    in [place]="normal exam" per pt    There were no vitals filed for this visit.   Subjective Assessment - 04/07/18 0938    Subjective  Patient reports insidious onset in January 2020 of tightness in right hip but has pain down her entire leg to foot. She reports some weakness as well.    Pertinent History  DM, HTN, hypothyroid    How long can you sit comfortably?  1 hour    How long can you walk comfortably?  1 hour    Diagnostic tests  none    Patient Stated Goals  get rid of pain    Currently in Pain?  Yes    Pain Score  6     Pain Location  Hip    Pain Orientation  Right    Pain Descriptors / Indicators  Tightness;Heaviness    Pain Type  Acute pain    Pain Radiating Towards  to medial arch    Pain Onset  More than a month ago    Pain Frequency  Constant    Aggravating Factors   walking and standing    Pain Relieving Factors  stretching    Effect of Pain on Daily Activities  everything hurts         [facility] PT Assessment - 04/07/18 0001      Assessment   Medical Diagnosis  right greater trochanter syndrome and possible  foraminal stenosis    Referring Provider (PT)  Eunice Blase    Prior Therapy  no      Precautions   Precautions  None      Restrictions   Weight Bearing Restrictions  No      Balance Screen   Has the patient fallen in the past 6 months  No    Has the patient had a decrease in activity level because of a fear of falling?   No    Is the patient reluctant to leave their home because of a fear of falling?   No      Home Environment   Living Environment  Private residence    Additional Comments  no problem with stairs      Prior Function   Level of Independence  Independent    Vocation  Retired    Leisure  walk, dance      Posture/Postural Control   Posture/Postural Control  No significant limitations      ROM / Strength  AROM / PROM / Strength  AROM;Strength      AROM   Overall AROM Comments  Lumbar WNL, Bil hips WFL      Strength   Overall Strength Comments  Bil hips 5/5, knee ext 5/5, flex 4+/5; bil ankle DF 5/5      Flexibility   Soft Tissue Assessment /Muscle Length  yes    Hamstrings  bil tightness    Quadriceps  bil tightness    ITB  bil tightness    Piriformis  bil tightness    Quadratus Lumborum  tight on right      Palpation   Spinal mobility  decreased in lumbar due to tightness    Palpation comment  marked tenderness in right gluteus min and med, QL      Special Tests   Other special tests  positive ober bil; neg SLR and slump on right; neg hip scour Rt                Objective measurements completed on examination: See above findings.              PT Education - 04/07/18 1024    Education Details  HEP; MFR with ball    Person(s) Educated  Patient    Methods  Explanation;Demonstration;Handout    Comprehension  Verbalized understanding;Returned demonstration       PT Short Term Goals - 04/07/18 1032      PT SHORT TERM GOAL #1   Title  Ind with initial HEP    Time  1    Period  Weeks    Status  New        PT Long  Term Goals - 04/07/18 1032      PT LONG TERM GOAL #1   Title  Patient able to amublate without pain.    Time  4    Period  Weeks    Status  New    Target Date  05/05/18      PT LONG TERM GOAL #2   Title  Patient able to sleep without waking from right hip/leg pain.    Time  4    Period  Weeks    Status  New      PT LONG TERM GOAL #3   Title  Patient to demo 5/5 knee strength to improve function.    Time  4    Period  Weeks    Status  New      PT LONG TERM GOAL #4   Title  no reports of heaviness in RLE    Time  4    Period  Weeks    Status  New             Plan - 04/07/18 1024    Clinical Impression Statement  Patient presents with c/o insidious onset of right hip and leg pain beginning in January 2020. She walks daily, but has pain down her RLE to medial arch with standing and walking. She also has pain at night disrupting her sleep. She has marked tightness in bil hips and LEs and tenderness in right gluteals. Pain appears to be of myofascial origin. She will benefit from PT to address these deficits and increase function.    Examination-Activity Limitations  Stand    Examination-Participation Restrictions  Other   walking   Stability/Clinical Decision Making  Stable/Uncomplicated    Clinical Decision Making  Low    Rehab Potential  Excellent    PT  Frequency  2x / week    PT Duration  4 weeks    PT Treatment/Interventions  ADLs/Self Care Home Management;Electrical Stimulation;Iontophoresis 4mg /ml Dexamethasone;Moist Heat;Ultrasound;Therapeutic exercise;Patient/family education;Manual techniques;Dry needling    PT Next Visit Plan  review HEP, MFR/DN to right glueals; flexibility and strengthening (HS); possibly ionto    PT Home Exercise Plan  stretches: HS, quads, PF, gastroc, ITB    Consulted and Agree with Plan of Care  Patient       Patient will benefit from skilled therapeutic intervention in order to improve the following deficits and impairments:  Pain,  Impaired flexibility, Decreased activity tolerance, Decreased strength  Visit Diagnosis: Pain in right hip - Plan: PT plan of care cert/re-cert  Stiffness of right hip, not elsewhere classified - Plan: PT plan of care cert/re-cert     Problem List Patient Active Problem List   Diagnosis Date Noted  . Insomnia 12/01/2017  . RLS (restless legs syndrome) 11/03/2017  . Anxiety 11/03/2017  . Colon polyps 11/03/2017  . PCP NOTES >>>>>>>>>>>>>>>>>>>>>> 06/01/2017  . Diabetes mellitus ([place])   . Hypertension   . Thyroid disease     Madelyn Flavors PT 04/07/2018, 10:38 AM  [address] [place], [place], [address] Phone: [phone]   Fax:  [phone]  Name: Abena Erdman MRN: [id number] Date of Birth: Jul 02, 1955

## 2018-04-11 ENCOUNTER — Ambulatory Visit: Payer: BLUE CROSS/BLUE SHIELD | Admitting: Physical Therapy

## 2018-04-11 DIAGNOSIS — M25651 Stiffness of right hip, not elsewhere classified: Secondary | ICD-10-CM

## 2018-04-11 DIAGNOSIS — M25551 Pain in right hip: Secondary | ICD-10-CM | POA: Diagnosis not present

## 2018-04-11 NOTE — Patient Instructions (Signed)
Trigger Point Dry Needling  . What is Trigger Point Dry Needling (DN)? o DN is a physical therapy technique used to treat muscle pain and dysfunction. Specifically, DN helps deactivate muscle trigger points (muscle knots).  o A thin filiform needle is used to penetrate the skin and stimulate the underlying trigger point. The goal is for a local twitch response (LTR) to occur and for the trigger point to relax. No medication of any kind is injected during the procedure.   . What Does Trigger Point Dry Needling Feel Like?  o The procedure feels different for each individual patient. Some patients report that they do not actually feel the needle enter the skin and overall the process is not painful. Very mild bleeding may occur. However, many patients feel a deep cramping in the muscle in which the needle was inserted. This is the local twitch response.   Marland Kitchen How Will I feel after the treatment? o Soreness is normal, and the onset of soreness may not occur for a few hours. Typically this soreness does not last longer than two days.  o Bruising is uncommon, however; ice can be used to decrease any possible bruising.  o In rare cases feeling tired or nauseous after the treatment is normal. In addition, your symptoms may get worse before they get better, this period will typically not last longer than 24 hours.   . What Can I do After My Treatment? o Increase your hydration by drinking more water for the next 24 hours. o You may place ice or heat on the areas treated that have become sore, however, do not use heat on inflamed or bruised areas. Heat often brings more relief post needling. o You can continue your regular activities, but vigorous activity is not recommended initially after the treatment for 24 hours. o DN is best combined with other physical therapy such as strengthening, stretching, and other therapies.     IONTOPHORESIS PATIENT PRECAUTIONS & CONTRAINDICATIONS:  . Redness under one or  both electrodes can occur.  This characterized by a uniform redness that usually disappears within 12 hours of treatment. . Small pinhead size blisters may result in response to the drug.  Contact your physician if the problem persists more than 24 hours. . On rare occasions, iontophoresis therapy can result in temporary skin reactions such as rash, inflammation, irritation or burns.  The skin reactions may be the result of individual sensitivity to the ionic solution used, the condition of the skin at the start of treatment, reaction to the materials in the electrodes, allergies or sensitivity to dexamethasone, or a poor connection between the patch and your skin.  Discontinue using iontophoresis if you have any of these reactions and report to your therapist. . Remove the Patch or electrodes if you have any undue sensation of pain or burning during the treatment and report discomfort to your therapist. . Tell your Therapist if you have had known adverse reactions to the application of electrical current.  . DO NOT use if you have a cardiac pacemaker or any other electrically sensitive implanted device. . DO NOT use if you have a known sensitivity to dexamethasone. . DO NOT use during Magnetic Resonance Imaging (MRI). . DO NOT use over broken or compromised skin (e.g. sunburn, cuts, or acne) due to the increased risk of skin reaction. . DO NOT SHAVE over the area to be treated:  To establish good contact between the Patch and the skin, excessive hair may be clipped.  Madelyn Flavors, PT 04/11/18 11:47 AM [address] [place], [place], [address] Phone: [phone]   Fax:  [phone]

## 2018-04-11 NOTE — Therapy (Addendum)
[address] [place], [place], [address] Phone: [phone]   Fax:  [phone]  Physical Therapy Treatment  Patient Details  Name: Kimberly Combs MRN: [id number] Date of Birth: 11-28-1955 Referring Provider (PT): Eunice Blase   Encounter Date: 04/11/2018  PT End of Session - 04/11/18 1106    Visit Number  2    Date for PT Re-Evaluation  05/05/18    PT Start Time  1104    PT Stop Time  1147    PT Time Calculation (min)  43 min    Activity Tolerance  Patient tolerated treatment well    Behavior During Therapy  [facility] for tasks assessed/performed       Past Medical History:  Diagnosis Date  . Diabetes mellitus ([place])   . Hypertension   . Thyroid disease     Past Surgical History:  Procedure Laterality Date  . CESAREAN SECTION  5732,2025   X2  . COLONOSCOPY  10 years ago    in [place]="normal exam" per pt    There were no vitals filed for this visit.  Subjective Assessment - 04/11/18 1106    Subjective  Patient reports compliance with HEP. she is sore today from gardening.    Currently in Pain?  Yes    Pain Score  4     Pain Location  Hip    Pain Orientation  Right    Pain Descriptors / Indicators  Tightness    Pain Type  Acute pain                       [facility] Adult PT Treatment/Exercise - 04/11/18 0001      Exercises   Exercises  Knee/Hip      Knee/Hip Exercises: Stretches   Passive Hamstring Stretch  Both;1 rep;60 seconds    Quad Stretch  Both;1 rep;30 seconds    ITB Stretch  Both;1 rep;60 seconds    Piriformis Stretch  Both;60 seconds    Gastroc Stretch  Both;1 rep;30 seconds      Knee/Hip Exercises: Aerobic   Elliptical  I5/R5 1.5 min fwd/1.5 min bwd    Nustep  L5 x 3 min      Modalities   Modalities  Iontophoresis      Iontophoresis   Type of Iontophoresis  Dexamethasone    Location  Right hip    Dose  32mmin    Time  4 hour patch      Manual Therapy   Manual  Therapy  Soft tissue mobilization;Myofascial release    Soft tissue mobilization  to Right gluteals,piriformis and lateral quads    Myofascial Release  to Right ITB       Trigger Point Dry Needling - 04/11/18 0001    Consent Given?  Yes    Education Handout Provided  Yes    Muscles Treated Back/Hip  Gluteus minimus;Gluteus medius;Gluteus maximus;Piriformis    Gluteus Minimus Response  Twitch response elicited;Palpable increased muscle length    Gluteus Medius Response  Twitch response elicited;Palpable increased muscle length    Gluteus Maximus Response  Palpable increased muscle length    Piriformis Response  Palpable increased muscle length           PT Education - 04/11/18 1210    Education Details  ionto and DN education    Person(s) Educated  Patient    Methods  Explanation;Demonstration;Handout    Comprehension  Verbalized understanding;Returned demonstration  PT Short Term Goals - 04/07/18 1032      PT SHORT TERM GOAL #1   Title  Ind with initial HEP    Time  1    Period  Weeks    Status  New        PT Long Term Goals - 04/07/18 1032      PT LONG TERM GOAL #1   Title  Patient able to amublate without pain.    Time  4    Period  Weeks    Status  New    Target Date  05/05/18      PT LONG TERM GOAL #2   Title  Patient able to sleep without waking from right hip/leg pain.    Time  4    Period  Weeks    Status  New      PT LONG TERM GOAL #3   Title  Patient to demo 5/5 knee strength to improve function.    Time  4    Period  Weeks    Status  New      PT LONG TERM GOAL #4   Title  no reports of heaviness in RLE    Time  4    Period  Weeks    Status  New            Plan - 04/11/18 1204    Clinical Impression Statement  Patient did well with TE today although elliptical was difficult. She demonstrated good form with stretches for HEP. She was educated and gave consent for both DN and iontophoresis. Good response to DN in gluteals.    PT  Treatment/Interventions  ADLs/Self Care Home Management;Electrical Stimulation;Iontophoresis 40m/ml Dexamethasone;Moist Heat;Ultrasound;Therapeutic exercise;Patient/family education;Manual techniques;Dry needling    PT Next Visit Plan  Assess repsonse to DN to right glueals and ionto; flexibility and strengthening (HS)    PT Home Exercise Plan  stretches: HS, quads, PF, gastroc, ITB       Patient will benefit from skilled therapeutic intervention in order to improve the following deficits and impairments:  Pain, Impaired flexibility, Decreased activity tolerance, Decreased strength  Visit Diagnosis: Pain in right hip  Stiffness of right hip, not elsewhere classified     Problem List Patient Active Problem List   Diagnosis Date Noted  . Insomnia 12/01/2017  . RLS (restless legs syndrome) 11/03/2017  . Anxiety 11/03/2017  . Colon polyps 11/03/2017  . PCP NOTES >>>>>>>>>>>>>>>>>>>>>> 06/01/2017  . Diabetes mellitus ([place]   . Hypertension   . Thyroid disease    PHYSICAL THERAPY DISCHARGE SUMMARY  Visits from Start of Care: 2 Plan: Patient agrees to discharge.  Patient goals were not met. Patient is being discharged due to not returning since the last visit.  ?????     JMadelyn FlavorsPT 04/11/2018, 12:12 PM  [address] [place] [address]Phone: [phone]  Fax:  [phone] Name: Kimberly CaseboltMRN: [id number] of Birth: 701/17/1957

## 2018-04-18 ENCOUNTER — Other Ambulatory Visit: Payer: Self-pay | Admitting: Internal Medicine

## 2018-04-18 ENCOUNTER — Ambulatory Visit: Payer: BLUE CROSS/BLUE SHIELD

## 2018-04-21 ENCOUNTER — Ambulatory Visit: Payer: BLUE CROSS/BLUE SHIELD

## 2018-05-08 ENCOUNTER — Other Ambulatory Visit: Payer: Self-pay | Admitting: Internal Medicine

## 2018-07-03 ENCOUNTER — Ambulatory Visit: Payer: BLUE CROSS/BLUE SHIELD | Admitting: Family Medicine

## 2018-07-04 ENCOUNTER — Ambulatory Visit: Payer: BLUE CROSS/BLUE SHIELD | Admitting: Family Medicine

## 2018-07-04 ENCOUNTER — Other Ambulatory Visit: Payer: Self-pay

## 2018-07-04 ENCOUNTER — Ambulatory Visit: Payer: Self-pay

## 2018-07-04 ENCOUNTER — Encounter: Payer: Self-pay | Admitting: Family Medicine

## 2018-07-04 ENCOUNTER — Ambulatory Visit (INDEPENDENT_AMBULATORY_CARE_PROVIDER_SITE_OTHER): Payer: BLUE CROSS/BLUE SHIELD | Admitting: Family Medicine

## 2018-07-04 DIAGNOSIS — M25562 Pain in left knee: Secondary | ICD-10-CM

## 2018-07-04 DIAGNOSIS — M25561 Pain in right knee: Secondary | ICD-10-CM | POA: Diagnosis not present

## 2018-07-04 MED ORDER — NABUMETONE 750 MG PO TABS: 750.0000 mg | ORAL_TABLET | Freq: Two times a day (BID) | ORAL | 6 refills | Status: DC | PRN

## 2018-07-04 MED ORDER — TIZANIDINE HCL 2 MG PO TABS: 2.0000 mg | ORAL_TABLET | Freq: Every evening | ORAL | 1 refills | Status: DC | PRN

## 2018-07-04 NOTE — Progress Notes (Signed)
I saw and examined the patient with Dr. Okey Dupre and agree with assessment and plan as outlined.  Left greater than right constant pain, worse with gardening.  Low back stiffness when lying down at night.  Tried NSAID, tylenol with no relief.  Trace effusion in left knee.  Trace bilateral patella crepitus.  Tender left medial joint line, no click with McMurray's.  X-Rays:  Very minimal patellofemoral spurring.  Overall knees look good.  Impression/plan:  Possible early left knee DJD.   - Try injection, meds.  PT if not improving.  MRI if fails conservative management.

## 2018-07-04 NOTE — Progress Notes (Signed)
  Kimberly Combs - 63 y.o. female MRN [id number]  Date of birth: 1955-07-26    SUBJECTIVE:      Chief Complaint: Knee pain  HPI:  63 year old female with 1 month of persistent left knee pain.  She denies any specific injury but does note doing a lot of yard work around that time.  She denies any history of significant knee pain.  Her pain is constant and is made worse with weightbearing/walking.  She has taken Tylenol and NSAIDs which have not helped.  She does get temporary relief with ice.  She denies any swelling, erythema or bruising.  No feeling of instability or locking.  She denies any numbness or tingling in the lower extremities.  She also has some pain in the right knee but less severe.  She does report some back pain at night when laying flat.  No associated fevers or chills.  No history of gout.   ROS:     See HPI. All other reviewed systems negative.  PERTINENT  PMH / PSH FH / / SH:  Past Medical, Surgical, Social, and Family History Reviewed & Updated in the EMR.   OBJECTIVE: There were no vitals taken for this visit.  Physical Exam:  Vital signs are reviewed.  GEN: Alert and oriented, NAD Pulm: Breathing unlabored PSY: normal mood, congruent affect  MSK: Left knee: - Inspection: No gross deformity.  Mild effusion. No erythema or bruising. Skin intact - Palpation: Tenderness over the medial joint line when the knee is fully flexed - ROM: full active ROM with flexion and extension in knee without crepitus - Strength: 5/5 strength - Neuro/vasc: NV intact - Special Tests: - LIGAMENTS:negative Lachman's, no MCL or LCL laxity  -- MENISCUS: negative McMurray's, -- PF JOINT: nml patellar mobility without apprehension.  Right knee: - Inspection: No gross deformity. No effusion. No erythema or bruising. Skin intact - Palpation: no TTP - ROM: full active ROM with flexion and extension in knee - Strength: 5/5 strength - Neuro/vasc: NV intact  Bilateral hips: No tenderness  over trochanter No pain with passive IR/ER   ASSESSMENT & PLAN:  1.  Acute knee pain secondary to arthritis vs meniscal injury. xrays obtained today show very mild medial joint space narrowing which does not seem to correlate well with her degree of pain. Favor dx of meniscal injury, no mechanical symptoms. - steroid injection as detailed below - relafen - zanaflex - PT referral - f/u PRN   Procedure performed: knee intraarticular corticosteroid injection; palpation guided Consent obtained and verified. Time-out conducted. Noted no overlying erythema, induration, or other signs of local infection. The LEFT lateral patellofemoral joint space was palpated and marked. The overlying skin was prepped in a sterile fashion. Topical analgesic spray: Ethyl chloride. Joint: left knee Needle: 25GA, 1.5" Completed without difficulty. Meds: lidocaine 5cc, depomedrol 40mg

## 2018-07-06 ENCOUNTER — Ambulatory Visit (INDEPENDENT_AMBULATORY_CARE_PROVIDER_SITE_OTHER): Payer: BLUE CROSS/BLUE SHIELD | Admitting: Internal Medicine

## 2018-07-06 ENCOUNTER — Other Ambulatory Visit: Payer: Self-pay

## 2018-07-06 VITALS — BP 132/78 | HR 75 | Temp 98.4°F | Ht 65.0 in | Wt 196.0 lb

## 2018-07-06 DIAGNOSIS — E079 Disorder of thyroid, unspecified: Secondary | ICD-10-CM

## 2018-07-06 DIAGNOSIS — I10 Essential (primary) hypertension: Secondary | ICD-10-CM | POA: Diagnosis not present

## 2018-07-06 DIAGNOSIS — G2581 Restless legs syndrome: Secondary | ICD-10-CM | POA: Diagnosis not present

## 2018-07-06 DIAGNOSIS — E119 Type 2 diabetes mellitus without complications: Secondary | ICD-10-CM | POA: Diagnosis not present

## 2018-07-06 LAB — BASIC METABOLIC PANEL
BUN: 15 mg/dL (ref 6–23)
CO2: 27 mEq/L (ref 19–32)
Calcium: 9.4 mg/dL (ref 8.4–10.5)
Chloride: 103 mEq/L (ref 96–112)
Creatinine, Ser: 0.72 mg/dL (ref 0.40–1.20)
GFR: 81.83 mL/min (ref >60.00)
Glucose, Bld: 133 mg/dL — ABNORMAL HIGH (ref 70–99)
Potassium: 3.7 mEq/L (ref 3.5–5.1)
Sodium: 140 mEq/L (ref 135–145)

## 2018-07-06 LAB — CBC WITH DIFFERENTIAL/PLATELET
Basophils Absolute: 0.1 10*3/uL (ref 0.0–0.1)
Basophils Relative: 0.6 % (ref 0.0–3.0)
Eosinophils Absolute: 0.1 10*3/uL (ref 0.0–0.7)
Eosinophils Relative: 1.3 % (ref 0.0–5.0)
HCT: 37.2 % (ref 36.0–46.0)
Hemoglobin: 12.2 g/dL (ref 12.0–15.0)
Lymphocytes Relative: 47.2 % — ABNORMAL HIGH (ref 12.0–46.0)
Lymphs Abs: 5.3 10*3/uL — ABNORMAL HIGH (ref 0.7–4.0)
MCHC: 32.6 g/dL (ref 30.0–36.0)
MCV: 81.9 fl (ref 78.0–100.0)
Monocytes Absolute: 0.7 10*3/uL (ref 0.1–1.0)
Monocytes Relative: 6.5 % (ref 3.0–12.0)
Neutro Abs: 5 10*3/uL (ref 1.4–7.7)
Neutrophils Relative %: 44.4 % (ref 43.0–77.0)
Platelets: 275 10*3/uL (ref 150.0–400.0)
RBC: 4.55 Mil/uL (ref 3.87–5.11)
RDW: 14.4 % (ref 11.5–15.5)
WBC: 11.2 10*3/uL — ABNORMAL HIGH (ref 4.0–10.5)

## 2018-07-06 LAB — LIPID PANEL
Cholesterol: 171 mg/dL (ref 0–200)
HDL: 80 mg/dL (ref >39.00)
LDL Cholesterol: 64 mg/dL (ref 0–99)
NonHDL: 91.29
Total CHOL/HDL Ratio: 2
Triglycerides: 135 mg/dL (ref 0.0–149.0)
VLDL: 27 mg/dL (ref 0.0–40.0)

## 2018-07-06 LAB — TSH: TSH: 1.59 u[IU]/mL (ref 0.35–4.50)

## 2018-07-06 LAB — HEMOGLOBIN A1C: Hgb A1c MFr Bld: 7.8 % — ABNORMAL HIGH (ref 4.6–6.5)

## 2018-07-06 NOTE — Patient Instructions (Addendum)
Get the blood work    Graf Schedule your next appointment  For a physical exam in 5 months   Ok nabumetone   as needed for pain.  Always take it with food because may cause gastritis and ulcers.  If you notice nausea, stomach pain, change in the color of stools --->  Stop the medicine and let us know  Ok tizanidine , watch for drowsiness   Flu shot by September   Check the  blood pressure   weekly   Be sure your blood pressure is between 110/65 and  135/85. If it is consistently higher or lower, let me know

## 2018-07-06 NOTE — Progress Notes (Addendum)
Subjective:    Patient ID: Kimberly Combs, female    DOB: Jun 03, 1955, 63 y.o.   MRN: [id number]  DOS:  07/06/2018 Type of visit - description: Routine office visit DM: Good compliance with medications, ambulatory CBGs reviewed HTN: Good med compliance, ambulatory BPs when checked are normal RLS: Self discontinued gabapentin, did not help much. MSK: Was referred to Ortho due to back pain, pain was determined to be from the hip, she is better but now she is having bilateral knee pain. COVID-19: Following good precautions  Review of Systems Denies fever chills.  No cough No chest pain no difficulty breathing No nausea, vomiting or diarrhea  Past Medical History:  Diagnosis Date  . Diabetes mellitus ([place])   . Hypertension   . Thyroid disease     Past Surgical History:  Procedure Laterality Date  . CESAREAN SECTION  [id number]   X2  . COLONOSCOPY  10 years ago    in [place]="normal exam" per pt    Social History   Socioeconomic History  . Marital status: Married    Spouse name: Ray  . Number of children: 2  . Years of education: Not on file  . Highest education level: Bachelor's degree (e.g., BA, AB, BS)  Occupational History  . Occupation: retired - Education officer, museum , [place]  Social Needs  . Financial resource strain: Not on file  . Food insecurity:    Worry: Not on file    Inability: Not on file  . Transportation needs:    Medical: Not on file    Non-medical: Not on file  Tobacco Use  . Smoking status: Former Smoker    Last attempt to quit: 2000    Years since quitting: 20.4  . Smokeless tobacco: Never Used  . Tobacco comment: light smoker, quit ~ 2000  Substance and Sexual Activity  . Alcohol use: No  . Drug use: Never  . Sexual activity: Not on file  Lifestyle  . Physical activity:    Days per week: Not on file    Minutes per session: Not on file  . Stress: Not on file  Relationships  . Social connections:    Talks on phone: Not on file    Gets together:  Not on file    Attends religious service: Not on file    Active member of club or organization: Not on file    Attends meetings of clubs or organizations: Not on file    Relationship status: Not on file  . Intimate partner violence:    Fear of current or ex partner: Not on file    Emotionally abused: Not on file    Physically abused: Not on file    Forced sexual activity: Not on file  Other Topics Concern  . Not on file  Social History Narrative   Born in [place]   Moved from [place] to [place] ~ 2015      Patient is right-handed. She lives with her husband in a 2 story house. She drinks one cup of deca coffee most days. She nad her husband walk occasionally.      Allergies as of 07/06/2018   No Known Allergies     Medication List       Accurate as of July 06, 2018 11:59 PM. If you have any questions, ask your nurse or doctor.        STOP taking these medications   diclofenac 75 MG EC tablet Commonly known as:  VOLTAREN Stopped by:  Kathlene November, MD   gabapentin 100 MG capsule Commonly known as:  NEURONTIN Stopped by:  Kathlene November, MD     TAKE these medications   acetaminophen 500 MG tablet Commonly known as:  TYLENOL Take 500 mg by mouth every 6 (six) hours as needed.   b complex vitamins tablet Take 1 tablet by mouth daily.   glucose blood test strip Commonly known as:  OneTouch Verio Check blood sugar three times daily   losartan 50 MG tablet Commonly known as:  COZAAR Take 50 mg by mouth daily.   metFORMIN 750 MG 24 hr tablet Commonly known as:  GLUCOPHAGE-XR Take 2 tablets by mouth every morning and 1 tablet by mouth every evening   nabumetone 750 MG tablet Commonly known as:  RELAFEN Take 1 tablet (750 mg total) by mouth 2 (two) times daily as needed.   ONE TOUCH LANCETS Misc Check blood sugar three times daily   OneTouch Verio Flex System w/Device Kit Check blood sugar 3 times daily   Synthroid 125 MCG tablet Generic drug:  levothyroxine Take 1 tablet (125 mcg  total) by mouth daily before breakfast.   tiZANidine 2 MG tablet Commonly known as:  ZANAFLEX Take 1-2 tablets (2-4 mg total) by mouth at bedtime as needed for muscle spasms.           Objective:   Physical Exam BP 132/78 (BP Location: Left Arm, Patient Position: Sitting, Cuff Size: Normal)   Pulse 75   Temp 98.4 F (36.9 C) (Oral)   Ht _0  (1.651 m)   Wt 196 lb (88.9 kg)   SpO2 94%   BMI 32.62 kg/m  General:   Well developed, NAD, BMI noted. HEENT:  Normocephalic . Face symmetric, atraumatic Neck: No thyromegaly Lungs:  CTA B Normal respiratory effort, no intercostal retractions, no accessory muscle use. Heart: RRR,  no murmur.  No pretibial edema bilaterally  Skin: Not pale. Not jaundice Neurologic:  alert & oriented X3.  Speech normal, gait appropriate for age and unassisted Psych--  Cognition and judgment appear intact.  Cooperative with normal attention span and concentration.  Behavior appropriate. No anxious or depressed appearing.      Assessment      Assessment (this is new patient 05/2017, used to see Dr. Delfina Redwood, this office is more conveniently located) DM: dx ~ 2014 HTN: dx ~ 2016 Anxiety, insomnia RLS, periodic limb movement per neuro 2019 Hypothyroidism dx remotely   Plan: DM: Good compliance with metformin, ambulatory CBGs typically 120-140 with occasional higher numbers.  Check A1c and FLP. HTN: Seems well controlled, continue losartan, check a BMP.  BP goals discussed. MSK: Saw Ortho for back pain, eventually was diagnosed with hip pain, better after PT.  Now having pain at the L>R knees, had a local injection at the left knee this week, was prescribed nabumetone and tizanidine.  Okay to take with precautions, see instructions. RLS: Self stopped gabapentin, no help.  Symptoms are now mild. Hypothyroidism: Continue Synthroid, check a TSH Preventive care: Declined Shingrix, benefits discussed RTC 5 months CPX.

## 2018-07-07 NOTE — Assessment & Plan Note (Signed)
DM: Good compliance with metformin, ambulatory CBGs typically 120-140 with occasional higher numbers.  Check A1c and FLP. HTN: Seems well controlled, continue losartan, check a BMP.  BP goals discussed. MSK: Saw Ortho for back pain, eventually was diagnosed with hip pain, better after PT.  Now having pain at the L>R knees, had a local injection at the left knee this week, was prescribed nabumetone and tizanidine.  Okay to take with precautions, see instructions. RLS: Self stopped gabapentin, no help.  Symptoms are now mild. Hypothyroidism: Continue Synthroid, check a TSH Preventive care: Declined Shingrix, benefits discussed RTC 5 months CPX.

## 2018-07-10 NOTE — Addendum Note (Signed)
Addended byDamita Dunnings D on: 07/10/2018 08:04 AM   Modules accepted: Orders

## 2018-08-08 ENCOUNTER — Other Ambulatory Visit: Payer: Self-pay | Admitting: Internal Medicine

## 2018-08-08 DIAGNOSIS — Z1231 Encounter for screening mammogram for malignant neoplasm of breast: Secondary | ICD-10-CM

## 2018-08-08 DIAGNOSIS — Z01419 Encounter for gynecological examination (general) (routine) without abnormal findings: Secondary | ICD-10-CM | POA: Diagnosis not present

## 2018-08-30 DIAGNOSIS — M5442 Lumbago with sciatica, left side: Secondary | ICD-10-CM | POA: Diagnosis not present

## 2018-08-30 DIAGNOSIS — M9903 Segmental and somatic dysfunction of lumbar region: Secondary | ICD-10-CM | POA: Diagnosis not present

## 2018-08-30 DIAGNOSIS — M5137 Other intervertebral disc degeneration, lumbosacral region: Secondary | ICD-10-CM | POA: Diagnosis not present

## 2018-08-30 DIAGNOSIS — M9906 Segmental and somatic dysfunction of lower extremity: Secondary | ICD-10-CM | POA: Diagnosis not present

## 2018-09-04 DIAGNOSIS — M5137 Other intervertebral disc degeneration, lumbosacral region: Secondary | ICD-10-CM | POA: Diagnosis not present

## 2018-09-04 DIAGNOSIS — M9905 Segmental and somatic dysfunction of pelvic region: Secondary | ICD-10-CM | POA: Diagnosis not present

## 2018-09-04 DIAGNOSIS — M5442 Lumbago with sciatica, left side: Secondary | ICD-10-CM | POA: Diagnosis not present

## 2018-09-04 DIAGNOSIS — M9903 Segmental and somatic dysfunction of lumbar region: Secondary | ICD-10-CM | POA: Diagnosis not present

## 2018-09-05 DIAGNOSIS — M9905 Segmental and somatic dysfunction of pelvic region: Secondary | ICD-10-CM | POA: Diagnosis not present

## 2018-09-05 DIAGNOSIS — M5137 Other intervertebral disc degeneration, lumbosacral region: Secondary | ICD-10-CM | POA: Diagnosis not present

## 2018-09-05 DIAGNOSIS — M9906 Segmental and somatic dysfunction of lower extremity: Secondary | ICD-10-CM | POA: Diagnosis not present

## 2018-09-05 DIAGNOSIS — M9903 Segmental and somatic dysfunction of lumbar region: Secondary | ICD-10-CM | POA: Diagnosis not present

## 2018-09-06 DIAGNOSIS — M9905 Segmental and somatic dysfunction of pelvic region: Secondary | ICD-10-CM | POA: Diagnosis not present

## 2018-09-06 DIAGNOSIS — M9903 Segmental and somatic dysfunction of lumbar region: Secondary | ICD-10-CM | POA: Diagnosis not present

## 2018-09-06 DIAGNOSIS — M5137 Other intervertebral disc degeneration, lumbosacral region: Secondary | ICD-10-CM | POA: Diagnosis not present

## 2018-09-06 DIAGNOSIS — M9906 Segmental and somatic dysfunction of lower extremity: Secondary | ICD-10-CM | POA: Diagnosis not present

## 2018-09-12 DIAGNOSIS — M5137 Other intervertebral disc degeneration, lumbosacral region: Secondary | ICD-10-CM | POA: Diagnosis not present

## 2018-09-12 DIAGNOSIS — M9905 Segmental and somatic dysfunction of pelvic region: Secondary | ICD-10-CM | POA: Diagnosis not present

## 2018-09-12 DIAGNOSIS — M9906 Segmental and somatic dysfunction of lower extremity: Secondary | ICD-10-CM | POA: Diagnosis not present

## 2018-09-12 DIAGNOSIS — M9903 Segmental and somatic dysfunction of lumbar region: Secondary | ICD-10-CM | POA: Diagnosis not present

## 2018-09-14 DIAGNOSIS — M9906 Segmental and somatic dysfunction of lower extremity: Secondary | ICD-10-CM | POA: Diagnosis not present

## 2018-09-14 DIAGNOSIS — M9905 Segmental and somatic dysfunction of pelvic region: Secondary | ICD-10-CM | POA: Diagnosis not present

## 2018-09-14 DIAGNOSIS — M9903 Segmental and somatic dysfunction of lumbar region: Secondary | ICD-10-CM | POA: Diagnosis not present

## 2018-09-14 DIAGNOSIS — M5137 Other intervertebral disc degeneration, lumbosacral region: Secondary | ICD-10-CM | POA: Diagnosis not present

## 2018-09-18 DIAGNOSIS — M9903 Segmental and somatic dysfunction of lumbar region: Secondary | ICD-10-CM | POA: Diagnosis not present

## 2018-09-18 DIAGNOSIS — M9906 Segmental and somatic dysfunction of lower extremity: Secondary | ICD-10-CM | POA: Diagnosis not present

## 2018-09-18 DIAGNOSIS — M9905 Segmental and somatic dysfunction of pelvic region: Secondary | ICD-10-CM | POA: Diagnosis not present

## 2018-09-18 DIAGNOSIS — M5137 Other intervertebral disc degeneration, lumbosacral region: Secondary | ICD-10-CM | POA: Diagnosis not present

## 2018-09-20 DIAGNOSIS — M5137 Other intervertebral disc degeneration, lumbosacral region: Secondary | ICD-10-CM | POA: Diagnosis not present

## 2018-09-20 DIAGNOSIS — M9906 Segmental and somatic dysfunction of lower extremity: Secondary | ICD-10-CM | POA: Diagnosis not present

## 2018-09-20 DIAGNOSIS — M9903 Segmental and somatic dysfunction of lumbar region: Secondary | ICD-10-CM | POA: Diagnosis not present

## 2018-09-20 DIAGNOSIS — M9905 Segmental and somatic dysfunction of pelvic region: Secondary | ICD-10-CM | POA: Diagnosis not present

## 2018-09-21 ENCOUNTER — Ambulatory Visit: Payer: BLUE CROSS/BLUE SHIELD

## 2018-09-21 DIAGNOSIS — M9903 Segmental and somatic dysfunction of lumbar region: Secondary | ICD-10-CM | POA: Diagnosis not present

## 2018-09-21 DIAGNOSIS — M5137 Other intervertebral disc degeneration, lumbosacral region: Secondary | ICD-10-CM | POA: Diagnosis not present

## 2018-09-21 DIAGNOSIS — M9905 Segmental and somatic dysfunction of pelvic region: Secondary | ICD-10-CM | POA: Diagnosis not present

## 2018-09-21 DIAGNOSIS — M9906 Segmental and somatic dysfunction of lower extremity: Secondary | ICD-10-CM | POA: Diagnosis not present

## 2018-09-26 DIAGNOSIS — M9905 Segmental and somatic dysfunction of pelvic region: Secondary | ICD-10-CM | POA: Diagnosis not present

## 2018-09-26 DIAGNOSIS — M9906 Segmental and somatic dysfunction of lower extremity: Secondary | ICD-10-CM | POA: Diagnosis not present

## 2018-09-26 DIAGNOSIS — M5137 Other intervertebral disc degeneration, lumbosacral region: Secondary | ICD-10-CM | POA: Diagnosis not present

## 2018-09-26 DIAGNOSIS — M9903 Segmental and somatic dysfunction of lumbar region: Secondary | ICD-10-CM | POA: Diagnosis not present

## 2018-09-27 DIAGNOSIS — M5137 Other intervertebral disc degeneration, lumbosacral region: Secondary | ICD-10-CM | POA: Diagnosis not present

## 2018-09-27 DIAGNOSIS — M9906 Segmental and somatic dysfunction of lower extremity: Secondary | ICD-10-CM | POA: Diagnosis not present

## 2018-09-27 DIAGNOSIS — M9903 Segmental and somatic dysfunction of lumbar region: Secondary | ICD-10-CM | POA: Diagnosis not present

## 2018-09-27 DIAGNOSIS — M9905 Segmental and somatic dysfunction of pelvic region: Secondary | ICD-10-CM | POA: Diagnosis not present

## 2018-10-04 DIAGNOSIS — M5137 Other intervertebral disc degeneration, lumbosacral region: Secondary | ICD-10-CM | POA: Diagnosis not present

## 2018-10-04 DIAGNOSIS — M9903 Segmental and somatic dysfunction of lumbar region: Secondary | ICD-10-CM | POA: Diagnosis not present

## 2018-10-04 DIAGNOSIS — M9906 Segmental and somatic dysfunction of lower extremity: Secondary | ICD-10-CM | POA: Diagnosis not present

## 2018-10-04 DIAGNOSIS — M9905 Segmental and somatic dysfunction of pelvic region: Secondary | ICD-10-CM | POA: Diagnosis not present

## 2018-10-05 ENCOUNTER — Encounter: Payer: Self-pay | Admitting: Internal Medicine

## 2018-10-06 ENCOUNTER — Encounter: Payer: Self-pay | Admitting: Family Medicine

## 2018-10-06 ENCOUNTER — Ambulatory Visit (INDEPENDENT_AMBULATORY_CARE_PROVIDER_SITE_OTHER): Payer: BLUE CROSS/BLUE SHIELD | Admitting: Family Medicine

## 2018-10-06 DIAGNOSIS — M25562 Pain in left knee: Secondary | ICD-10-CM

## 2018-10-06 DIAGNOSIS — G8929 Other chronic pain: Secondary | ICD-10-CM

## 2018-10-06 NOTE — Progress Notes (Signed)
Office Visit Note   Patient: Kimberly Combs           Date of Birth: 06/05/1955           MRN: [id number] Visit Date: 10/06/2018 Requested by: Colon Branch, [address] [place],  [place] [address] PCP: Colon Branch, MD  Subjective: Chief Complaint  Patient presents with  . Left Knee - Pain    Knee "always feels tight."  Has been seeing Dr. Jola Baptist (4 visits so far) - it's helping the back, but continues to have the knee pain.    HPI: She is here with persistent left knee pain.  Injection in June did not make that much difference for her.  She has been working with Dr. Jola Baptist for chiropractic treatment of her low back and her knee, and she is getting laser treatments which have been helping temporarily.  Her main point is pain whenever she tries to walk more than about 5 minutes.  Pain seems to be around the front of her knee.  She has not noticed any locking or giving way symptoms.               ROS: Denies fevers or chills.  All other systems were reviewed and are negative.  Objective: Vital Signs: There were no vitals taken for this visit.  Physical Exam:  General:  Alert and oriented, in no acute distress. Pulm:  Breathing unlabored. Psy:  Normal mood, congruent affect. Skin: No rash or erythema. Left knee: Trace effusion with no warmth.  Full range of motion, 1+ patellofemoral crepitus.  She is tender around the infrapatellar fat pad, has a little bit of pain on the lateral joint line as well.  No palpable click with McMurray's.  Imaging: None today.  Assessment & Plan: 1.  Persistent left knee pain, etiology uncertain.  Question degenerative meniscus tear versus referred pain from lumbar foraminal stenosis. -Discussed options with her and she wants to try 1 more cortisone injection.  We will also order an MRI scan of her knee.  If no improvement with injection and MRI is unrevealing, then consider MRI lumbar spine.     Procedures: Left  knee steroid injection: After sterile prep with Betadine, injected 3 cc 1% lidocaine without epinephrine and 40 mg of methylprednisolone from lateral midpatellar approach.    PMFS History: Patient Active Problem List   Diagnosis Date Noted  . Insomnia 12/01/2017  . RLS (restless legs syndrome) 11/03/2017  . Anxiety 11/03/2017  . Colon polyps 11/03/2017  . PCP NOTES >>>>>>>>>>>>>>>>>>>>>> 06/01/2017  . Diabetes mellitus ([place])   . Hypertension   . Thyroid disease    Past Medical History:  Diagnosis Date  . Diabetes mellitus ([place])   . Hypertension   . Thyroid disease     Family History  Problem Relation Age of Onset  . Arthritis Mother   . Diabetes Father   . CAD Neg Hx   . Colon cancer Neg Hx   . Breast cancer Neg Hx   . Esophageal cancer Neg Hx   . Stomach cancer Neg Hx     Past Surgical History:  Procedure Laterality Date  . CESAREAN SECTION  [id number]   X2  . COLONOSCOPY  10 years ago    in [place]="normal exam" per pt   Social History   Occupational History  . Occupation: retired - Education officer, museum , [place]  Tobacco Use  . Smoking status: Former Smoker  Quit date: 2000    Years since quitting: 20.6  . Smokeless tobacco: Never Used  . Tobacco comment: light smoker, quit ~ 2000  Substance and Sexual Activity  . Alcohol use: No  . Drug use: Never  . Sexual activity: Not on file

## 2018-10-11 DIAGNOSIS — M5137 Other intervertebral disc degeneration, lumbosacral region: Secondary | ICD-10-CM | POA: Diagnosis not present

## 2018-10-11 DIAGNOSIS — M9905 Segmental and somatic dysfunction of pelvic region: Secondary | ICD-10-CM | POA: Diagnosis not present

## 2018-10-11 DIAGNOSIS — M9906 Segmental and somatic dysfunction of lower extremity: Secondary | ICD-10-CM | POA: Diagnosis not present

## 2018-10-11 DIAGNOSIS — M9903 Segmental and somatic dysfunction of lumbar region: Secondary | ICD-10-CM | POA: Diagnosis not present

## 2018-10-18 DIAGNOSIS — M9906 Segmental and somatic dysfunction of lower extremity: Secondary | ICD-10-CM | POA: Diagnosis not present

## 2018-10-18 DIAGNOSIS — M9905 Segmental and somatic dysfunction of pelvic region: Secondary | ICD-10-CM | POA: Diagnosis not present

## 2018-10-18 DIAGNOSIS — M5137 Other intervertebral disc degeneration, lumbosacral region: Secondary | ICD-10-CM | POA: Diagnosis not present

## 2018-10-18 DIAGNOSIS — M9903 Segmental and somatic dysfunction of lumbar region: Secondary | ICD-10-CM | POA: Diagnosis not present

## 2018-10-25 DIAGNOSIS — M9903 Segmental and somatic dysfunction of lumbar region: Secondary | ICD-10-CM | POA: Diagnosis not present

## 2018-10-25 DIAGNOSIS — M5137 Other intervertebral disc degeneration, lumbosacral region: Secondary | ICD-10-CM | POA: Diagnosis not present

## 2018-10-25 DIAGNOSIS — M9905 Segmental and somatic dysfunction of pelvic region: Secondary | ICD-10-CM | POA: Diagnosis not present

## 2018-10-25 DIAGNOSIS — M9906 Segmental and somatic dysfunction of lower extremity: Secondary | ICD-10-CM | POA: Diagnosis not present

## 2018-10-28 ENCOUNTER — Ambulatory Visit
Admission: RE | Admit: 2018-10-28 | Discharge: 2018-10-28 | Disposition: A | Discharge status: Home / Self Care | Payer: BLUE CROSS/BLUE SHIELD | Source: Ambulatory Visit | Attending: Family Medicine | Admitting: Family Medicine

## 2018-10-28 ENCOUNTER — Other Ambulatory Visit: Payer: Self-pay

## 2018-10-28 DIAGNOSIS — G8929 Other chronic pain: Secondary | ICD-10-CM

## 2018-10-28 DIAGNOSIS — M25562 Pain in left knee: Secondary | ICD-10-CM | POA: Diagnosis not present

## 2018-10-30 ENCOUNTER — Telehealth: Payer: Self-pay | Admitting: Family Medicine

## 2018-10-30 DIAGNOSIS — G8929 Other chronic pain: Secondary | ICD-10-CM

## 2018-10-30 DIAGNOSIS — M25562 Pain in left knee: Secondary | ICD-10-CM

## 2018-10-30 NOTE — Telephone Encounter (Signed)
Knee MRI looks good overall.  Very mild arthritis, but no meniscus cartilage tears or other indication for surgery.  I don't think her pain is from the knee.  Presuming she's still in pain, I recommend ordering MRI of the lumbar spine to look for a pinched nerve.

## 2018-10-30 NOTE — Telephone Encounter (Signed)
I called and spoke with the patient about her MRI results. The knee is feeling a little better post injection, but does continue to have pain. She would like to proceed with the Lspine MRI - order has already been placed.

## 2018-11-01 DIAGNOSIS — M5137 Other intervertebral disc degeneration, lumbosacral region: Secondary | ICD-10-CM | POA: Diagnosis not present

## 2018-11-01 DIAGNOSIS — M9905 Segmental and somatic dysfunction of pelvic region: Secondary | ICD-10-CM | POA: Diagnosis not present

## 2018-11-01 DIAGNOSIS — M9903 Segmental and somatic dysfunction of lumbar region: Secondary | ICD-10-CM | POA: Diagnosis not present

## 2018-11-01 DIAGNOSIS — M9906 Segmental and somatic dysfunction of lower extremity: Secondary | ICD-10-CM | POA: Diagnosis not present

## 2018-11-04 ENCOUNTER — Other Ambulatory Visit: Payer: Self-pay | Admitting: Internal Medicine

## 2018-11-08 DIAGNOSIS — M9905 Segmental and somatic dysfunction of pelvic region: Secondary | ICD-10-CM | POA: Diagnosis not present

## 2018-11-08 DIAGNOSIS — M9903 Segmental and somatic dysfunction of lumbar region: Secondary | ICD-10-CM | POA: Diagnosis not present

## 2018-11-08 DIAGNOSIS — M9906 Segmental and somatic dysfunction of lower extremity: Secondary | ICD-10-CM | POA: Diagnosis not present

## 2018-11-08 DIAGNOSIS — M5137 Other intervertebral disc degeneration, lumbosacral region: Secondary | ICD-10-CM | POA: Diagnosis not present

## 2018-11-18 ENCOUNTER — Other Ambulatory Visit: Payer: Self-pay | Admitting: Family Medicine

## 2018-11-22 ENCOUNTER — Ambulatory Visit
Admission: RE | Admit: 2018-11-22 | Discharge: 2018-11-22 | Disposition: A | Discharge status: Home / Self Care | Payer: BLUE CROSS/BLUE SHIELD | Source: Ambulatory Visit | Attending: Family Medicine | Admitting: Family Medicine

## 2018-11-22 ENCOUNTER — Telehealth: Payer: Self-pay | Admitting: Radiology

## 2018-11-22 DIAGNOSIS — G8929 Other chronic pain: Secondary | ICD-10-CM

## 2018-11-22 DIAGNOSIS — M25562 Pain in left knee: Secondary | ICD-10-CM

## 2018-11-22 NOTE — Telephone Encounter (Signed)
Patient came into office this morning after MRI attempted. She is too claustrophobic and requests open MRI be ordered. Please have MRI rescheduled for open MRI.  Pt CB  [phone]

## 2018-11-22 NOTE — Telephone Encounter (Signed)
I called and left a message on the patient's voice mail: Would she also like [place] to call in something to calm her for the exam (in conjunction with having an open MRI)? If so, please verify which pharmacy she prefers.

## 2018-11-23 MED ORDER — DIAZEPAM 5 MG PO TABS: ORAL_TABLET | ORAL | 0 refills | Status: DC

## 2018-11-23 NOTE — Telephone Encounter (Signed)
The patient needs an open MRI - could not go through with the MRI yesterday. Can we also call in something to relax her?

## 2018-11-23 NOTE — Addendum Note (Signed)
Addended by: Hortencia Pilar on: 11/23/2018 10:54 AM   Modules accepted: Orders

## 2018-11-23 NOTE — Telephone Encounter (Signed)
Rx sent 

## 2018-11-23 NOTE — Telephone Encounter (Signed)
This patient needs an open MRI (Lsp). Could not do the regular MRI yesterday. Does this need a new order?

## 2018-11-28 ENCOUNTER — Other Ambulatory Visit: Payer: BLUE CROSS/BLUE SHIELD

## 2018-11-29 ENCOUNTER — Other Ambulatory Visit: Payer: Self-pay

## 2018-11-29 DIAGNOSIS — M5442 Lumbago with sciatica, left side: Secondary | ICD-10-CM

## 2018-11-29 DIAGNOSIS — G8929 Other chronic pain: Secondary | ICD-10-CM

## 2018-11-29 NOTE — Telephone Encounter (Signed)
New order placed

## 2018-11-29 NOTE — Telephone Encounter (Signed)
Yes, please place new order, pt will need to go to [facility] for the OPEN UNIT.

## 2018-12-06 ENCOUNTER — Other Ambulatory Visit: Payer: Self-pay

## 2018-12-07 ENCOUNTER — Telehealth: Payer: Self-pay | Admitting: *Deleted

## 2018-12-07 ENCOUNTER — Encounter: Payer: Self-pay | Admitting: Internal Medicine

## 2018-12-07 ENCOUNTER — Ambulatory Visit (INDEPENDENT_AMBULATORY_CARE_PROVIDER_SITE_OTHER): Payer: BLUE CROSS/BLUE SHIELD | Admitting: Internal Medicine

## 2018-12-07 VITALS — BP 133/61 | HR 69 | Temp 95.9°F | Resp 16 | Ht 65.0 in | Wt 188.2 lb

## 2018-12-07 DIAGNOSIS — E119 Type 2 diabetes mellitus without complications: Secondary | ICD-10-CM | POA: Diagnosis not present

## 2018-12-07 DIAGNOSIS — E079 Disorder of thyroid, unspecified: Secondary | ICD-10-CM | POA: Diagnosis not present

## 2018-12-07 DIAGNOSIS — E785 Hyperlipidemia, unspecified: Secondary | ICD-10-CM

## 2018-12-07 DIAGNOSIS — Z23 Encounter for immunization: Secondary | ICD-10-CM

## 2018-12-07 DIAGNOSIS — Z0000 Encounter for general adult medical examination without abnormal findings: Secondary | ICD-10-CM

## 2018-12-07 LAB — CBC WITH DIFFERENTIAL/PLATELET
Basophils Absolute: 0.1 10*3/uL (ref 0.0–0.1)
Basophils Relative: 0.9 % (ref 0.0–3.0)
Eosinophils Absolute: 0.2 10*3/uL (ref 0.0–0.7)
Eosinophils Relative: 1.9 % (ref 0.0–5.0)
HCT: 40.4 % (ref 36.0–46.0)
Hemoglobin: 13.2 g/dL (ref 12.0–15.0)
Lymphocytes Relative: 37.7 % (ref 12.0–46.0)
Lymphs Abs: 3.3 10*3/uL (ref 0.7–4.0)
MCHC: 32.5 g/dL (ref 30.0–36.0)
MCV: 81.9 fl (ref 78.0–100.0)
Monocytes Absolute: 0.6 10*3/uL (ref 0.1–1.0)
Monocytes Relative: 6.8 % (ref 3.0–12.0)
Neutro Abs: 4.6 10*3/uL (ref 1.4–7.7)
Neutrophils Relative %: 52.7 % (ref 43.0–77.0)
Platelets: 301 10*3/uL (ref 150.0–400.0)
RBC: 4.94 Mil/uL (ref 3.87–5.11)
RDW: 14.3 % (ref 11.5–15.5)
WBC: 8.8 10*3/uL (ref 4.0–10.5)

## 2018-12-07 LAB — COMPREHENSIVE METABOLIC PANEL
ALT: 39 U/L — ABNORMAL HIGH (ref 0–35)
AST: 26 U/L (ref 0–37)
Albumin: 4.4 g/dL (ref 3.5–5.2)
Alkaline Phosphatase: 88 U/L (ref 39–117)
BUN: 12 mg/dL (ref 6–23)
CO2: 31 mEq/L (ref 19–32)
Calcium: 10 mg/dL (ref 8.4–10.5)
Chloride: 101 mEq/L (ref 96–112)
Creatinine, Ser: 0.81 mg/dL (ref 0.40–1.20)
GFR: 71.33 mL/min (ref >60.00)
Glucose, Bld: 136 mg/dL — ABNORMAL HIGH (ref 70–99)
Potassium: 4.1 mEq/L (ref 3.5–5.1)
Sodium: 140 mEq/L (ref 135–145)
Total Bilirubin: 0.5 mg/dL (ref 0.2–1.2)
Total Protein: 7.9 g/dL (ref 6.0–8.3)

## 2018-12-07 LAB — TSH: TSH: 2.24 u[IU]/mL (ref 0.35–4.50)

## 2018-12-07 LAB — HEMOGLOBIN A1C: Hgb A1c MFr Bld: 7.3 % — ABNORMAL HIGH (ref 4.6–6.5)

## 2018-12-07 MED ORDER — ATORVASTATIN CALCIUM 10 MG PO TABS: 10.0000 mg | ORAL_TABLET | Freq: Every day | ORAL | 3 refills | Status: DC

## 2018-12-07 NOTE — Telephone Encounter (Signed)
Pt is scheduled for MRI on Mon Nov 9 at 12:45pm with arrival at 12/15pm at [facility], I called and left vm to return call for appt information.

## 2018-12-07 NOTE — Progress Notes (Signed)
Pre visit review using our clinic review tool, if applicable. No additional management support is needed unless otherwise documented below in the visit note. 

## 2018-12-07 NOTE — Patient Instructions (Addendum)
GO TO THE LAB : Get the blood work     GO TO THE FRONT DESK Schedule your next appointment   for blood work only 2 months from today  Next visit with me in 6 months, please schedule an appointment  Start taking a new medication for cholesterol called atorvastatin 10 mg 1 tablet at bedtime

## 2018-12-07 NOTE — Progress Notes (Signed)
Subjective:    Patient ID: Kimberly Combs, female    DOB: 06-Jul-1955, 63 y.o.   MRN: [id number]  DOS:  12/07/2018 Type of visit - description: CPX In general feeling well. Has improve her diet, lost few pounds. MSK: Knee pain improved, still somewhat reluctant to exercise.  Wt Readings from Last 3 Encounters:  12/07/18 188 lb 4 oz (85.4 kg)  07/06/18 196 lb (88.9 kg)  03/07/18 193 lb 2 oz (87.6 kg)    Review of Systems  Other than above, a 14 point review of systems is negative    Past Medical History:  Diagnosis Date  . Diabetes mellitus ([place])   . Hypertension   . Thyroid disease     Past Surgical History:  Procedure Laterality Date  . CESAREAN SECTION  [id number]   X2  . COLONOSCOPY  10 years ago    in [place]="normal exam" per pt    Social History   Socioeconomic History  . Marital status: Married    Spouse name: Ray  . Number of children: 2  . Years of education: Not on file  . Highest education level: Bachelor's degree (e.g., BA, AB, BS)  Occupational History  . Occupation: retired - Education officer, museum , [place]  Social Needs  . Financial resource strain: Not on file  . Food insecurity    Worry: Not on file    Inability: Not on file  . Transportation needs    Medical: Not on file    Non-medical: Not on file  Tobacco Use  . Smoking status: Former Smoker    Quit date: 2000    Years since quitting: 20.8  . Smokeless tobacco: Never Used  . Tobacco comment: light smoker, quit ~ 2000  Substance and Sexual Activity  . Alcohol use: No  . Drug use: Never  . Sexual activity: Not on file  Lifestyle  . Physical activity    Days per week: Not on file    Minutes per session: Not on file  . Stress: Not on file  Relationships  . Social Herbalist on phone: Not on file    Gets together: Not on file    Attends religious service: Not on file    Active member of club or organization: Not on file    Attends meetings of clubs or organizations: Not on file    Relationship status: Not on file  . Intimate partner violence    Fear of current or ex partner: Not on file    Emotionally abused: Not on file    Physically abused: Not on file    Forced sexual activity: Not on file  Other Topics Concern  . Not on file  Social History Narrative   Born in [place]   Moved from [place] to [place] ~ 2015      Patient is right-handed. She lives with her husband in a 2 story house. She drinks one cup of deca coffee most days. She nad her husband walk occasionally.     Family History  Problem Relation Age of Onset  . Arthritis Mother   . Diabetes Father   . CAD Neg Hx   . Colon cancer Neg Hx   . Breast cancer Neg Hx   . Esophageal cancer Neg Hx   . Stomach cancer Neg Hx      Allergies as of 12/07/2018   No Known Allergies     Medication List       Accurate as of  December 07, 2018 11:59 PM. If you have any questions, ask your nurse or doctor.        STOP taking these medications   diazepam 5 MG tablet Commonly known as: VALIUM Stopped by: Kathlene November, MD   nabumetone 750 MG tablet Commonly known as: RELAFEN Stopped by: Kathlene November, MD   tiZANidine 2 MG tablet Commonly known as: ZANAFLEX Stopped by: Kathlene November, MD     TAKE these medications   acetaminophen 500 MG tablet Commonly known as: TYLENOL Take 500 mg by mouth every 6 (six) hours as needed.   atorvastatin 10 MG tablet Commonly known as: LIPITOR Take 1 tablet (10 mg total) by mouth at bedtime. Started by: Kathlene November, MD   b complex vitamins tablet Take 1 tablet by mouth daily.   glucose blood test strip Commonly known as: OneTouch Verio Check blood sugar three times daily   losartan 50 MG tablet Commonly known as: COZAAR Take 50 mg by mouth daily.   metFORMIN 750 MG 24 hr tablet Commonly known as: GLUCOPHAGE-XR TAKE 2 TABLETS BY MOUTH EVERY MORNING AND 1 EVERY EVENING   ONE TOUCH LANCETS Misc Check blood sugar three times daily   OneTouch Verio Flex System w/Device Kit Check  blood sugar 3 times daily   Synthroid 125 MCG tablet Generic drug: levothyroxine Take 1 tablet (125 mcg total) by mouth daily before breakfast.           Objective: exam   Physical Exam BP 133/61 (BP Location: Left Arm, Patient Position: Sitting, Cuff Size: Normal)   Pulse 69   Temp (!) 95.9 F (35.5 C) (Temporal)   Resp 16   Ht '5\' 5"'  (1.651 m)   Wt 188 lb 4 oz (85.4 kg)   SpO2 99%   BMI 31.33 kg/m      General: Well developed, NAD, BMI noted Neck: No  thyromegaly  HEENT:  Normocephalic . Face symmetric, atraumatic Lungs:  CTA B Normal respiratory effort, no intercostal retractions, no accessory muscle use. Heart: RRR,  no murmur.  No pretibial edema bilaterally  Abdomen:  Not distended, soft, non-tender. No rebound or rigidity.   Skin: Exposed areas without rash. Not pale. Not jaundice Neurologic:  alert & oriented X3.  Speech normal, gait appropriate for age and unassisted Strength symmetric and appropriate for age.  Psych: Cognition and judgment appear intact.  Cooperative with normal attention span and concentration.  Behavior appropriate. No anxious or depressed appearing.   Assessment     Assessment (this is new patient 05/2017, used to see Dr. Delfina Redwood, this office is more conveniently located) DM: dx ~ 2014 HTN: dx ~ 2016 Anxiety, insomnia RLS, periodic limb movement per neuro 2019 Hypothyroidism dx remotely    PLAN Here for CPX DM: On metformin, last A1c slightly elevated, doing great with, lost few pounds, recheck a A1c. Lipitor: Although FLP looks very good, guidelines  recommend statins to every patient with diabetes.  Benefits discussed, start atorvastatin, labs in 2 months. HTN: Currently on losartan, controlled. Hypothyroidism: On Synthroid, check labs. RTC for labs 2 months RTC for visit 6 months

## 2018-12-08 NOTE — Assessment & Plan Note (Signed)
-   Td 2017 - PNM 23: 2019 - shingrix d/w pt, not interested  - flu sho today. CCS: Colonoscopy in [place] around 2009, s/p colonoscopy in [facility] 07/04/2017 Female care:  [facility] 06-2017.  Pap/HPV 08/03/2017 per [facility] DEXA: 2016, recheck 2021 -Labs: CBC, CMP, A1c, TSH -Diet and exercise discussed RTC 4 months

## 2018-12-08 NOTE — Assessment & Plan Note (Signed)
Here for CPX DM: On metformin, last A1c slightly elevated, doing great with, lost few pounds, recheck a A1c. Lipitor: Although FLP looks very good, guidelines  recommend statins to every patient with diabetes.  Benefits discussed, start atorvastatin, labs in 2 months. HTN: Currently on losartan, controlled. Hypothyroidism: On Synthroid, check labs. RTC for labs 2 months RTC for visit 6 months

## 2018-12-08 NOTE — Telephone Encounter (Signed)
Pt aware of appt.

## 2018-12-11 DIAGNOSIS — M47816 Spondylosis without myelopathy or radiculopathy, lumbar region: Secondary | ICD-10-CM | POA: Diagnosis not present

## 2018-12-11 DIAGNOSIS — M5126 Other intervertebral disc displacement, lumbar region: Secondary | ICD-10-CM | POA: Diagnosis not present

## 2018-12-11 DIAGNOSIS — M5432 Sciatica, left side: Secondary | ICD-10-CM | POA: Diagnosis not present

## 2018-12-11 DIAGNOSIS — R9389 Abnormal findings on diagnostic imaging of other specified body structures: Secondary | ICD-10-CM | POA: Diagnosis not present

## 2018-12-12 ENCOUNTER — Other Ambulatory Visit: Payer: Self-pay | Admitting: Internal Medicine

## 2018-12-26 ENCOUNTER — Telehealth: Payer: Self-pay | Admitting: Internal Medicine

## 2018-12-26 ENCOUNTER — Telehealth: Payer: Self-pay | Admitting: Family Medicine

## 2018-12-26 DIAGNOSIS — R9389 Abnormal findings on diagnostic imaging of other specified body structures: Secondary | ICD-10-CM

## 2018-12-26 NOTE — Telephone Encounter (Signed)
Sorry for the delay - I never received a full report, but found it in the chart.  MRI shows thickening of the endometrium of the uterus.  She should have a pelvic ultrasound done to better evaluate.  I will send a note to her PCP and she should call him to discuss.  She has narrowing of her spinal canal at two levels due to disc bulges and bone spurs.  This could potentially be the cause of her knee/leg pain.  If still having trouble, could refer her to Dr. Ernestina Patches for an epidural steroid injection.     Report:   IMPRESSION:  1. There is a possible area of endometrial thickening. Correlate with pelvic ultrasound. 2. Moderate spinal stenosis at L2-L3 due to disc bulge and facet arthropathy 3. Moderate to severe spinal stenosis at L3-L4 due to disc bulge and facet arthropathy 4. Mild degenerative disc disease at L4-L5 with disc bulge that extends more to the right. There is facet arthropathy with facet enlargement and moderate stenosis of the right lateral recess.

## 2018-12-26 NOTE — Telephone Encounter (Signed)
Patient had an MRI ordered by Ortho, they found a area of endometrial thickening, radiology recommend ultrasound. Please call patient:  Make her aware, referred to gynecology, DX thickened endometrium per MRI.

## 2018-12-26 NOTE — Telephone Encounter (Signed)
LMOM informing Pt to return call. Okay for PEC to discuss. Referral placed to Dr. Nelda Marseille.

## 2018-12-27 NOTE — Telephone Encounter (Signed)
I called to advise the patient of the results. Kimberly Combs asked if Kimberly Combs could come in to see Dr. Junius Roads to go over these, instead. Appointment scheduled for 12/02 at 9:20.

## 2019-01-03 ENCOUNTER — Encounter: Payer: Self-pay | Admitting: Family Medicine

## 2019-01-03 ENCOUNTER — Other Ambulatory Visit: Payer: Self-pay

## 2019-01-03 ENCOUNTER — Ambulatory Visit (INDEPENDENT_AMBULATORY_CARE_PROVIDER_SITE_OTHER): Payer: BLUE CROSS/BLUE SHIELD | Admitting: Family Medicine

## 2019-01-03 DIAGNOSIS — M48061 Spinal stenosis, lumbar region without neurogenic claudication: Secondary | ICD-10-CM | POA: Diagnosis not present

## 2019-01-03 NOTE — Progress Notes (Signed)
   Office Visit Note   Patient: Kimberly Combs           Date of Birth: 1955-07-04           MRN: [id number] Visit Date: 01/03/2019 Requested by: Colon Branch, [address] [place],  [place] [address] PCP: Colon Branch, MD  Subjective: Chief Complaint  Patient presents with  . Lower Back - Pain    MRI review    HPI: She is here to discuss lumbar MRI results.  She continues to have pain in both legs intermittently, especially when standing for a long time.  If she sits down or leans forward, her pain improves.  MRI shows possible endometrial thickening.  She is scheduled to meet with her gynecologist next week to discuss this.  She also has moderate stenosis at L2-3 due to disc bulge and facet hypertrophy.  There is moderate to severe stenosis at L3-4 and moderate right lateral recess stenosis at L4-5.               ROS: No fevers or chills.  No bowel or bladder dysfunction.  All other systems were reviewed and are negative.  Objective: Vital Signs: There were no vitals taken for this visit.  Physical Exam:  General:  Alert and oriented, in no acute distress. Pulm:  Breathing unlabored. Psy:  Normal mood, congruent affect.  Low back: Negative bilateral straight leg raise, lower extremity strength and reflexes are still normal.  Imaging: None today.  Assessment & Plan: 1.  Lumbar stenosis with disc bulging and facet hypertrophy -We discussed treatment options, she wants to try an epidural injection.  Possibly resume physical therapy if it does not provide good enough relief.     Procedures: No procedures performed  No notes on file     PMFS History: Patient Active Problem List   Diagnosis Date Noted  . Annual physical exam 12/07/2018  . Insomnia 12/01/2017  . RLS (restless legs syndrome) 11/03/2017  . Anxiety 11/03/2017  . Colon polyps 11/03/2017  . PCP NOTES >>>>>>>>>>>>>>>>>>>>>> 06/01/2017  . Diabetes mellitus ()   . Hypertension   . Thyroid  disease    Past Medical History:  Diagnosis Date  . Diabetes mellitus ([place])   . Hypertension   . Thyroid disease     Family History  Problem Relation Age of Onset  . Arthritis Mother   . Diabetes Father   . CAD Neg Hx   . Colon cancer Neg Hx   . Breast cancer Neg Hx   . Esophageal cancer Neg Hx   . Stomach cancer Neg Hx     Past Surgical History:  Procedure Laterality Date  . CESAREAN SECTION  [id number]   X2  . COLONOSCOPY  10 years ago    in [place]="normal exam" per pt   Social History   Occupational History  . Occupation: retired - Education officer, museum , [place]  Tobacco Use  . Smoking status: Former Smoker    Quit date: 2000    Years since quitting: 20.9  . Smokeless tobacco: Never Used  . Tobacco comment: light smoker, quit ~ 2000  Substance and Sexual Activity  . Alcohol use: No  . Drug use: Never  . Sexual activity: Not on file

## 2019-01-09 ENCOUNTER — Telehealth: Payer: Self-pay

## 2019-01-09 DIAGNOSIS — H2513 Age-related nuclear cataract, bilateral: Secondary | ICD-10-CM | POA: Diagnosis not present

## 2019-01-09 DIAGNOSIS — E119 Type 2 diabetes mellitus without complications: Secondary | ICD-10-CM | POA: Diagnosis not present

## 2019-01-09 DIAGNOSIS — H18413 Arcus senilis, bilateral: Secondary | ICD-10-CM | POA: Diagnosis not present

## 2019-01-09 DIAGNOSIS — Z7984 Long term (current) use of oral hypoglycemic drugs: Secondary | ICD-10-CM | POA: Diagnosis not present

## 2019-01-09 LAB — HM DIABETES EYE EXAM

## 2019-01-09 NOTE — Telephone Encounter (Signed)
Results placed in mail again.

## 2019-01-09 NOTE — Telephone Encounter (Signed)
Copied from [place] [id number]. Topic: General - Other >> Jan 09, 2019 10:52 AM Leward Quan A wrote: Reason for CRM: Patient called to say that labs mailed out on 12/11/2018 was not received asking if it can be mailed out to her again please.

## 2019-01-17 ENCOUNTER — Telehealth: Payer: Self-pay | Admitting: Family Medicine

## 2019-01-17 DIAGNOSIS — R9389 Abnormal findings on diagnostic imaging of other specified body structures: Secondary | ICD-10-CM | POA: Diagnosis not present

## 2019-01-17 NOTE — Telephone Encounter (Signed)
Received vm from [facility] stating she received referral from our office for abnormal MRI and wants report. I faxed 11/24 phone note from Dr. Junius Roads which he states the findings as we do not have the report. Fax [phone]

## 2019-01-19 ENCOUNTER — Telehealth: Payer: Self-pay | Admitting: Family Medicine

## 2019-01-19 NOTE — Telephone Encounter (Signed)
Ulice Dash from [facility] requesting MRI results for patient. Please fax results with Attention Ulice Dash. [place] phone number is [phone] and fax number is [phone].

## 2019-01-19 NOTE — Telephone Encounter (Signed)
12/26/18 phone note refaxed to attn Ulice Dash

## 2019-01-31 ENCOUNTER — Ambulatory Visit (INDEPENDENT_AMBULATORY_CARE_PROVIDER_SITE_OTHER): Payer: BLUE CROSS/BLUE SHIELD | Admitting: Physical Medicine and Rehabilitation

## 2019-01-31 ENCOUNTER — Other Ambulatory Visit: Payer: Self-pay

## 2019-01-31 ENCOUNTER — Encounter: Payer: Self-pay | Admitting: Physical Medicine and Rehabilitation

## 2019-01-31 ENCOUNTER — Ambulatory Visit: Payer: Self-pay

## 2019-01-31 VITALS — BP 148/84 | HR 74

## 2019-01-31 DIAGNOSIS — M5416 Radiculopathy, lumbar region: Secondary | ICD-10-CM

## 2019-01-31 MED ORDER — METHYLPREDNISOLONE ACETATE 80 MG/ML IJ SUSP
40.0000 mg | Freq: Once | INTRAMUSCULAR | Status: AC
  Administered 2019-01-31: 15:00:00 40 mg

## 2019-01-31 NOTE — Progress Notes (Signed)
Pt states pain in the lower back and radiates into the left leg all the way down. Pt states pain started the beginning of this year. Pt states walking and standing for along period of time makes pain worse. Sitting helps with pain.   .Numeric Pain Rating Scale and Functional Assessment Average Pain 7   In the last MONTH (on 0-10 scale) has pain interfered with the following?  1. General activity like being  able to carry out your everyday physical activities such as walking, climbing stairs, carrying groceries, or moving a chair?  Rating(8)   +Driver, -BT, -Dye Allergies.

## 2019-02-06 ENCOUNTER — Other Ambulatory Visit: Payer: BLUE CROSS/BLUE SHIELD

## 2019-02-14 ENCOUNTER — Other Ambulatory Visit: Payer: Self-pay

## 2019-02-14 ENCOUNTER — Other Ambulatory Visit (INDEPENDENT_AMBULATORY_CARE_PROVIDER_SITE_OTHER): Payer: BLUE CROSS/BLUE SHIELD

## 2019-02-14 DIAGNOSIS — E785 Hyperlipidemia, unspecified: Secondary | ICD-10-CM | POA: Diagnosis not present

## 2019-02-14 LAB — LIPID PANEL
Cholesterol: 227 mg/dL — ABNORMAL HIGH (ref 0–200)
HDL: 74.4 mg/dL (ref >39.00)
LDL Cholesterol: 132 mg/dL — ABNORMAL HIGH (ref 0–99)
NonHDL: 152.85
Total CHOL/HDL Ratio: 3
Triglycerides: 103 mg/dL (ref 0.0–149.0)
VLDL: 20.6 mg/dL (ref 0.0–40.0)

## 2019-02-14 LAB — ALT: ALT: 63 U/L — ABNORMAL HIGH (ref 0–35)

## 2019-02-14 LAB — AST: AST: 43 U/L — ABNORMAL HIGH (ref 0–37)

## 2019-02-26 ENCOUNTER — Telehealth: Payer: Self-pay

## 2019-02-26 NOTE — Telephone Encounter (Signed)
Did you contact Pt?  °

## 2019-02-26 NOTE — Telephone Encounter (Signed)
Spoke with the patient regards labs from the last few days. She was taking Lipitor until she ran out. Last cholesterol was much higher than the FLP from 07/2018 which is unusual. LFTs were also slightly elevated, she does not take Tylenol or EtOH. At this point we elected to simply continue with current medications, do not take Lipitor, continue with her healthier diet in the last few weeks and keep the appointment in May 2021.

## 2019-02-26 NOTE — Telephone Encounter (Signed)
Patient called in to return Dr. Larose Kells call. Dr. Larose Kells left a voicemail telling the patient to give him a call back. Please follow up with the patient at [phone] thanks.

## 2019-03-01 DIAGNOSIS — R9389 Abnormal findings on diagnostic imaging of other specified body structures: Secondary | ICD-10-CM | POA: Diagnosis not present

## 2019-05-28 ENCOUNTER — Encounter: Payer: Self-pay | Admitting: Internal Medicine

## 2019-06-07 ENCOUNTER — Ambulatory Visit (INDEPENDENT_AMBULATORY_CARE_PROVIDER_SITE_OTHER): Payer: BLUE CROSS/BLUE SHIELD | Admitting: Internal Medicine

## 2019-06-07 ENCOUNTER — Encounter: Payer: Self-pay | Admitting: Internal Medicine

## 2019-06-07 ENCOUNTER — Other Ambulatory Visit: Payer: Self-pay

## 2019-06-07 VITALS — BP 139/77 | HR 72 | Temp 96.7°F | Resp 18 | Ht 65.0 in | Wt 182.2 lb

## 2019-06-07 DIAGNOSIS — E079 Disorder of thyroid, unspecified: Secondary | ICD-10-CM | POA: Diagnosis not present

## 2019-06-07 DIAGNOSIS — E119 Type 2 diabetes mellitus without complications: Secondary | ICD-10-CM

## 2019-06-07 DIAGNOSIS — M48061 Spinal stenosis, lumbar region without neurogenic claudication: Secondary | ICD-10-CM

## 2019-06-07 DIAGNOSIS — E785 Hyperlipidemia, unspecified: Secondary | ICD-10-CM | POA: Diagnosis not present

## 2019-06-07 DIAGNOSIS — Z1159 Encounter for screening for other viral diseases: Secondary | ICD-10-CM

## 2019-06-07 DIAGNOSIS — I10 Essential (primary) hypertension: Secondary | ICD-10-CM | POA: Diagnosis not present

## 2019-06-07 DIAGNOSIS — R21 Rash and other nonspecific skin eruption: Secondary | ICD-10-CM

## 2019-06-07 LAB — LIPID PANEL
Cholesterol: 160 mg/dL (ref 0–200)
HDL: 50.9 mg/dL (ref >39.00)
LDL Cholesterol: 81 mg/dL (ref 0–99)
NonHDL: 108.76
Total CHOL/HDL Ratio: 3
Triglycerides: 139 mg/dL (ref 0.0–149.0)
VLDL: 27.8 mg/dL (ref 0.0–40.0)

## 2019-06-07 LAB — TSH: TSH: 1.41 u[IU]/mL (ref 0.35–4.50)

## 2019-06-07 LAB — COMPREHENSIVE METABOLIC PANEL
ALT: 27 U/L (ref 0–35)
AST: 19 U/L (ref 0–37)
Albumin: 4.5 g/dL (ref 3.5–5.2)
Alkaline Phosphatase: 77 U/L (ref 39–117)
BUN: 10 mg/dL (ref 6–23)
CO2: 31 mEq/L (ref 19–32)
Calcium: 9.7 mg/dL (ref 8.4–10.5)
Chloride: 104 mEq/L (ref 96–112)
Creatinine, Ser: 0.77 mg/dL (ref 0.40–1.20)
GFR: 75.51 mL/min (ref >60.00)
Glucose, Bld: 120 mg/dL — ABNORMAL HIGH (ref 70–99)
Potassium: 4.5 mEq/L (ref 3.5–5.1)
Sodium: 139 mEq/L (ref 135–145)
Total Bilirubin: 0.4 mg/dL (ref 0.2–1.2)
Total Protein: 7.6 g/dL (ref 6.0–8.3)

## 2019-06-07 LAB — HEMOGLOBIN A1C: Hgb A1c MFr Bld: 7.5 % — ABNORMAL HIGH (ref 4.6–6.5)

## 2019-06-07 MED ORDER — BETAMETHASONE DIPROPIONATE AUG 0.05 % EX CREA: TOPICAL_CREAM | Freq: Two times a day (BID) | CUTANEOUS | 0 refills | Status: DC

## 2019-06-07 NOTE — Progress Notes (Signed)
Pre visit review using our clinic review tool, if applicable. No additional management support is needed unless otherwise documented below in the visit note. 

## 2019-06-07 NOTE — Progress Notes (Signed)
Subjective:    Patient ID: Kimberly Combs, female    DOB: 08/02/1955, 63 y.o.   MRN: [id number]  DOS:  06/07/2019 Type of visit - description: Follow-up In addition to DM, HTN, high cholesterol we also talk about a rash and back pain.  4 weeks history of itchy rash on the left pretibial area.  No other areas affected.  She does lots of work at her yard.  Back pain: Ongoing issue.  Medication?  Wt Readings from Last 3 Encounters:  06/07/19 182 lb 4 oz (82.7 kg)  12/07/18 188 lb 4 oz (85.4 kg)  07/06/18 196 lb (88.9 kg)     Review of Systems Denies chest pain or difficulty breathing No lower extremity edema  Past Medical History:  Diagnosis Date  . Diabetes mellitus ([place])   . Hypertension   . Thyroid disease     Past Surgical History:  Procedure Laterality Date  . CESAREAN SECTION  4765,4650   X2  . COLONOSCOPY  10 years ago    in [place]="normal exam" per pt    Allergies as of 06/07/2019   No Known Allergies     Medication List       Accurate as of Jun 07, 2019  9:47 AM. If you have any questions, ask your nurse or doctor.        acetaminophen 500 MG tablet Commonly known as: TYLENOL Take 500 mg by mouth every 6 (six) hours as needed.   b complex vitamins tablet Take 1 tablet by mouth daily.   glucose blood test strip Commonly known as: OneTouch Verio Check blood sugar three times daily   losartan 50 MG tablet Commonly known as: COZAAR Take 50 mg by mouth daily.   metFORMIN 750 MG 24 hr tablet Commonly known as: GLUCOPHAGE-XR TAKE 2 TABLETS BY MOUTH EVERY MORNING AND 1 EVERY EVENING   ONE TOUCH LANCETS Misc Check blood sugar three times daily   OneTouch Verio Flex System w/Device Kit Check blood sugar 3 times daily   Synthroid 125 MCG tablet Generic drug: levothyroxine Take 1 tablet (125 mcg total) by mouth daily before breakfast.   tiZANidine 2 MG tablet Commonly known as: ZANAFLEX TAKE 1 TO 2 TABLETS(2 TO 4 MG) BY MOUTH AT BEDTIME AS NEEDED FOR  MUSCLE SPASMS          Objective:   Physical Exam BP (!) 151/63 (BP Location: Left Arm, Patient Position: Sitting, Cuff Size: Normal)   Pulse 72   Temp (!) 96.7 F (35.9 C) (Temporal)   Resp 18   Ht '5\' 5"'  (1.651 m)   Wt 182 lb 4 oz (82.7 kg)   SpO2 100%   BMI 30.33 kg/m  General:   Well developed, NAD, BMI noted.  HEENT:  Normocephalic . Face symmetric, atraumatic Lungs:  CTA B Normal respiratory effort, no intercostal retractions, no accessory muscle use. Heart: RRR,  no murmur.  Abdomen:  Not distended, soft, non-tender. No rebound or rigidity.  No organomegaly Skin: Not pale. Not jaundice Lower extremities: Several slightly papular, pink, 2 to 3 mm in size areas at the pretibial area, left. Neurologic:  alert & oriented X3.  Speech normal, gait appropriate for age and unassisted Psych--  Cognition and judgment appear intact.  Cooperative with normal attention span and concentration.  Behavior appropriate. No anxious or depressed appearing.     Assessment     Assessment (this is new patient 05/2017, used to see Dr. Delfina Redwood, this office is more conveniently located) DM:  dx ~ 2014 HTN: dx ~ 2016 Anxiety, insomnia RLS, periodic limb movement per neuro 2019 Hypothyroidism dx remotely  MSK: Back pain, had MRI, DX lumbar stenosis (ortho OV 01-2019)  PLAN DM: Last A1c elevated at 7.3, she is working hard on her diet, has lost few pounds.  Recheck A1c, continue Metformin. HTN: On losartan, BP slightly elevated today recheck: 139/77.  No change Check a CMP High cholesterol: Has declined medication so far, explained that with a diagnosis of diabetes is the standard of care to take statins.  Check FLP.  Doing well with lifestyle Hypothyroidism: Check a TSH Lumbar stenosis: Sees orthopedic surgery, reports 1 lumbar injection did not help, Zanaflex and Numebatone not helping.  Has done physical therapy. Advised patient this is a chronic issue, unlikely that a single  intervention will completely diminish the pain.  She is reluctant to take more lumbar injections or do more physical therapy. For now then I recommend ibuprofen which helps as needed with GI precautions.  See AVS Increase LFTs: Does not take Tylenol, does not drink alcohol, has not taking nabumetone in a while.  Recheck today, due for a hep C screening.  Will do. Preventive care: Reluctant to proceed with Covid vaccination, pros cons discussed. Rash: Located at the pretibial area, probably contact dermatitis, topical steroid sent.  Call if not better RTC 4 months    This visit occurred during the SARS-CoV-2 public health emergency.  Safety protocols were in place, including screening questions prior to the visit, additional usage of staff PPE, and extensive cleaning of exam room while observing appropriate contact time as indicated for disinfecting solutions.

## 2019-06-07 NOTE — Patient Instructions (Addendum)
COVID-19 Vaccine Information can be found at: [email or web address] For questions related to vaccine distribution or appointments, please email vaccine@[email or web address] or call [phone].   Check the  blood pressure weekly after resting for 10 minutes BP GOAL is between 110/65 and  135/85. If it is consistently higher or lower, let me know  IBUPROFEN (Advil or Motrin) 200 mg 2 tablets 2 times a day at most.   No more than 15 or 16  tablets a week Always take it with food because may cause gastritis and ulcers.  If you notice nausea, stomach pain, change in the color of stools --->  Stop the medicine and let us know    GO TO THE LAB : Get the blood work     [place], [place] back for   a checkup in 4 months

## 2019-06-08 DIAGNOSIS — M48061 Spinal stenosis, lumbar region without neurogenic claudication: Secondary | ICD-10-CM | POA: Insufficient documentation

## 2019-06-08 LAB — HEPATITIS C ANTIBODY
Hepatitis C Ab: NONREACTIVE
SIGNAL TO CUT-OFF: 0.01 (ref <1.00)

## 2019-06-08 NOTE — Assessment & Plan Note (Signed)
DM: Last A1c elevated at 7.3, she is working hard on her diet, has lost few pounds.  Recheck A1c, continue Metformin. HTN: On losartan, BP slightly elevated today recheck: 139/77.  No change Check a CMP High cholesterol: Has declined medication so far, explained that with a diagnosis of diabetes is the standard of care to take statins.  Check FLP.  Doing well with lifestyle Hypothyroidism: Check a TSH Lumbar stenosis: Sees orthopedic surgery, reports 1 lumbar injection did not help, Zanaflex and Numebatone not helping.  Has done physical therapy. Advised patient this is a chronic issue, unlikely that a single intervention will completely diminish the pain.  She is reluctant to take more lumbar injections or do more physical therapy. For now then I recommend ibuprofen which helps as needed with GI precautions.  See AVS Increase LFTs: Does not take Tylenol, does not drink alcohol, has not taking nabumetone in a while.  Recheck today, due for a hep C screening.  Will do. Preventive care: Reluctant to proceed with Covid vaccination, pros cons discussed. Rash: Located at the pretibial area, probably contact dermatitis, topical steroid sent.  Call if not better RTC 4 months

## 2019-06-12 ENCOUNTER — Other Ambulatory Visit: Payer: Self-pay

## 2019-06-12 ENCOUNTER — Encounter: Payer: Self-pay | Admitting: Family Medicine

## 2019-06-12 ENCOUNTER — Ambulatory Visit (INDEPENDENT_AMBULATORY_CARE_PROVIDER_SITE_OTHER): Payer: BLUE CROSS/BLUE SHIELD | Admitting: Family Medicine

## 2019-06-12 DIAGNOSIS — M545 Low back pain, unspecified: Secondary | ICD-10-CM

## 2019-06-12 MED ORDER — CYANOCOBALAMIN 1000 MCG/ML IJ SOLN: 1000.0000 ug | Freq: Every day | INTRAMUSCULAR | 3 refills | Status: DC

## 2019-06-12 MED ORDER — GABAPENTIN 100 MG PO CAPS: ORAL_CAPSULE | ORAL | 3 refills | Status: DC

## 2019-06-12 NOTE — Progress Notes (Signed)
Office Visit Note   Patient: Kimberly Combs           Date of Birth: Oct 07, 1955           MRN: [id number] Visit Date: 06/12/2019 Requested by: Colon Branch, [address] [place],  [place] [address] PCP: Colon Branch, MD  Subjective: Chief Complaint  Patient presents with  . Lower Back - Pain    Since last week, she has had constant pain with standing, sitting, lying - no new injury. The pain goes into the thighs, right worse than left. No relief with topical agents/Tylenol.    HPI: She is here with worsening low back pain and right greater than left leg pain.  Unfortunately she is not doing well, she has constant pain no matter what she is doing.  She has tried a variety of over-the-counter medicines as well as tizanidine and Relafen.  Her brother, who lives in [place], has a similar back problem.  He has done well with injections of dolo neurobion, which is a high potency B12.  She wondered whether she could try something similar.  She is very reluctant to consider surgical intervention.              ROS:   All other systems were reviewed and are negative.  Objective: Vital Signs: There were no vitals taken for this visit.  Physical Exam:  General:  Alert and oriented, in no acute distress. Pulm:  Breathing unlabored. Psy:  Normal mood, congruent affect.  Low back: She is tender in the gluteus medius areas bilaterally.  Straight leg raise is negative, lower extremity strength and reflexes are still normal.  Imaging: No results found.  Assessment & Plan: 1.  Low back with bilateral leg pain and L2-3, L3-4 and L4-5 stenosis -We will try gabapentin.  We will try B12 injections daily even though it is not the same formulation she was hoping for. -She will call me after couple weeks to let me know how she is doing.  If gabapentin helps, we can increase the dosage if needed.  If she gets no relief with this regimen, then could contemplate surgical  consult.     Procedures: No procedures performed  No notes on file     PMFS History: Patient Active Problem List   Diagnosis Date Noted  . Spinal stenosis of lumbar region 06/08/2019  . Annual physical exam 12/07/2018  . Insomnia 12/01/2017  . RLS (restless legs syndrome) 11/03/2017  . Anxiety 11/03/2017  . Colon polyps 11/03/2017  . PCP NOTES >>>>>>>>>>>>>>>>>>>>>> 06/01/2017  . Diabetes mellitus ([place])   . Hypertension   . Thyroid disease    Past Medical History:  Diagnosis Date  . Diabetes mellitus ([place])   . Hypertension   . Thyroid disease     Family History  Problem Relation Age of Onset  . Arthritis Mother   . Diabetes Father   . CAD Neg Hx   . Colon cancer Neg Hx   . Breast cancer Neg Hx   . Esophageal cancer Neg Hx   . Stomach cancer Neg Hx     Past Surgical History:  Procedure Laterality Date  . CESAREAN SECTION  [id number]   X2  . COLONOSCOPY  10 years ago    in [place]="normal exam" per pt   Social History   Occupational History  . Occupation: retired - Education officer, museum , [place]  Tobacco Use  . Smoking status: Former Smoker  Quit date: 2000    Years since quitting: 21.3  . Smokeless tobacco: Never Used  . Tobacco comment: light smoker, quit ~ 2000  Substance and Sexual Activity  . Alcohol use: No  . Drug use: Never  . Sexual activity: Not on file

## 2019-06-23 IMAGING — MG DIGITAL SCREENING BILATERAL MAMMOGRAM WITH CAD
4 series · 4 of 4 positions shown · non-contrast
Comparison: Previous exam(s).

CLINICAL DATA: Screening.

EXAM:
DIGITAL SCREENING BILATERAL MAMMOGRAM WITH CAD

[L MLO]
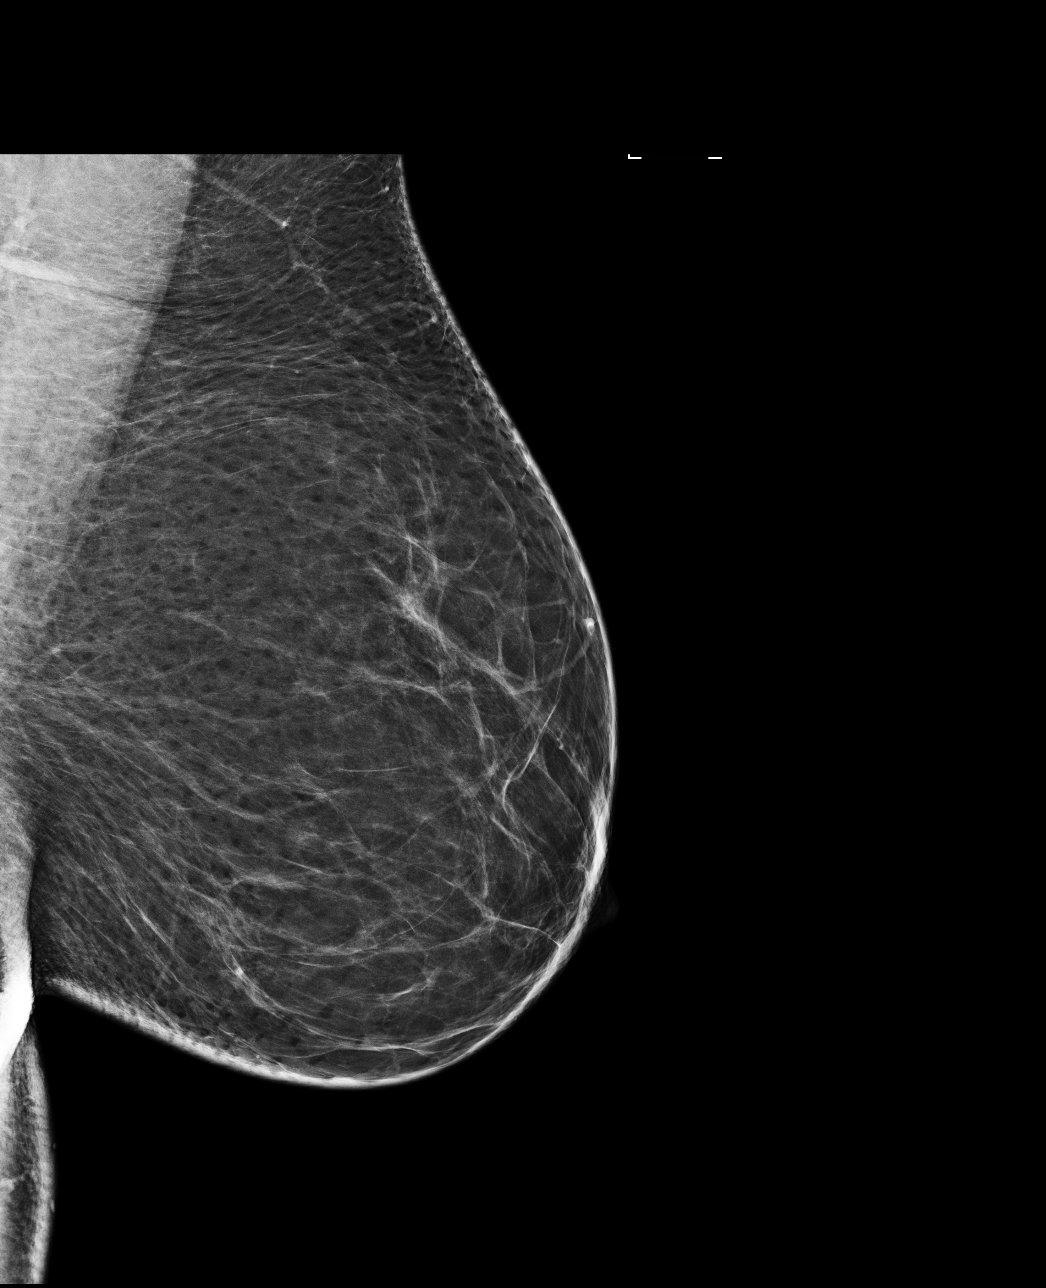

[R CC]
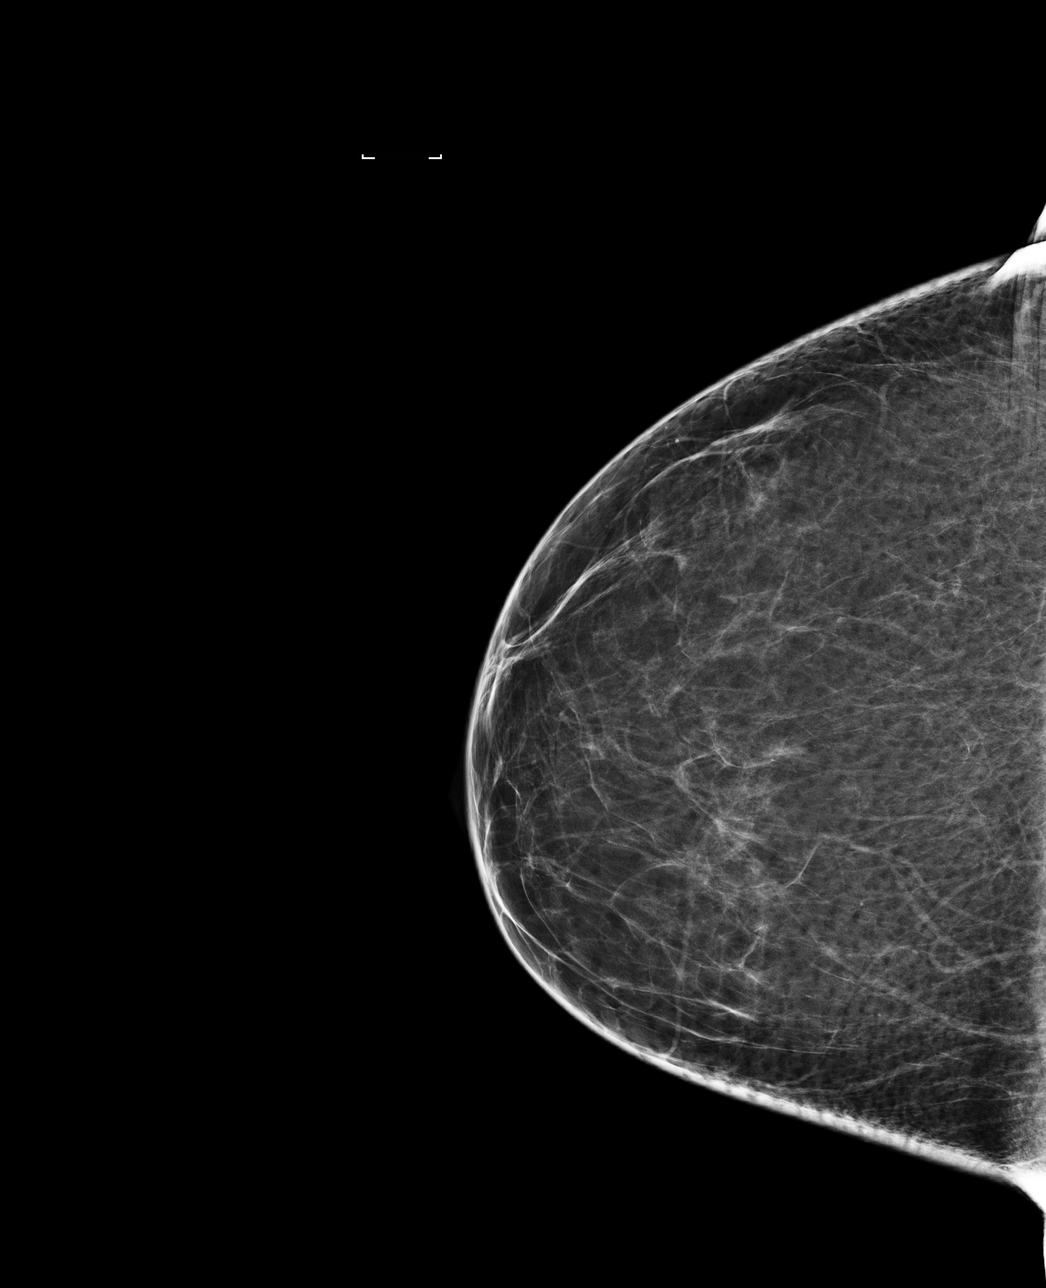

[R MLO]
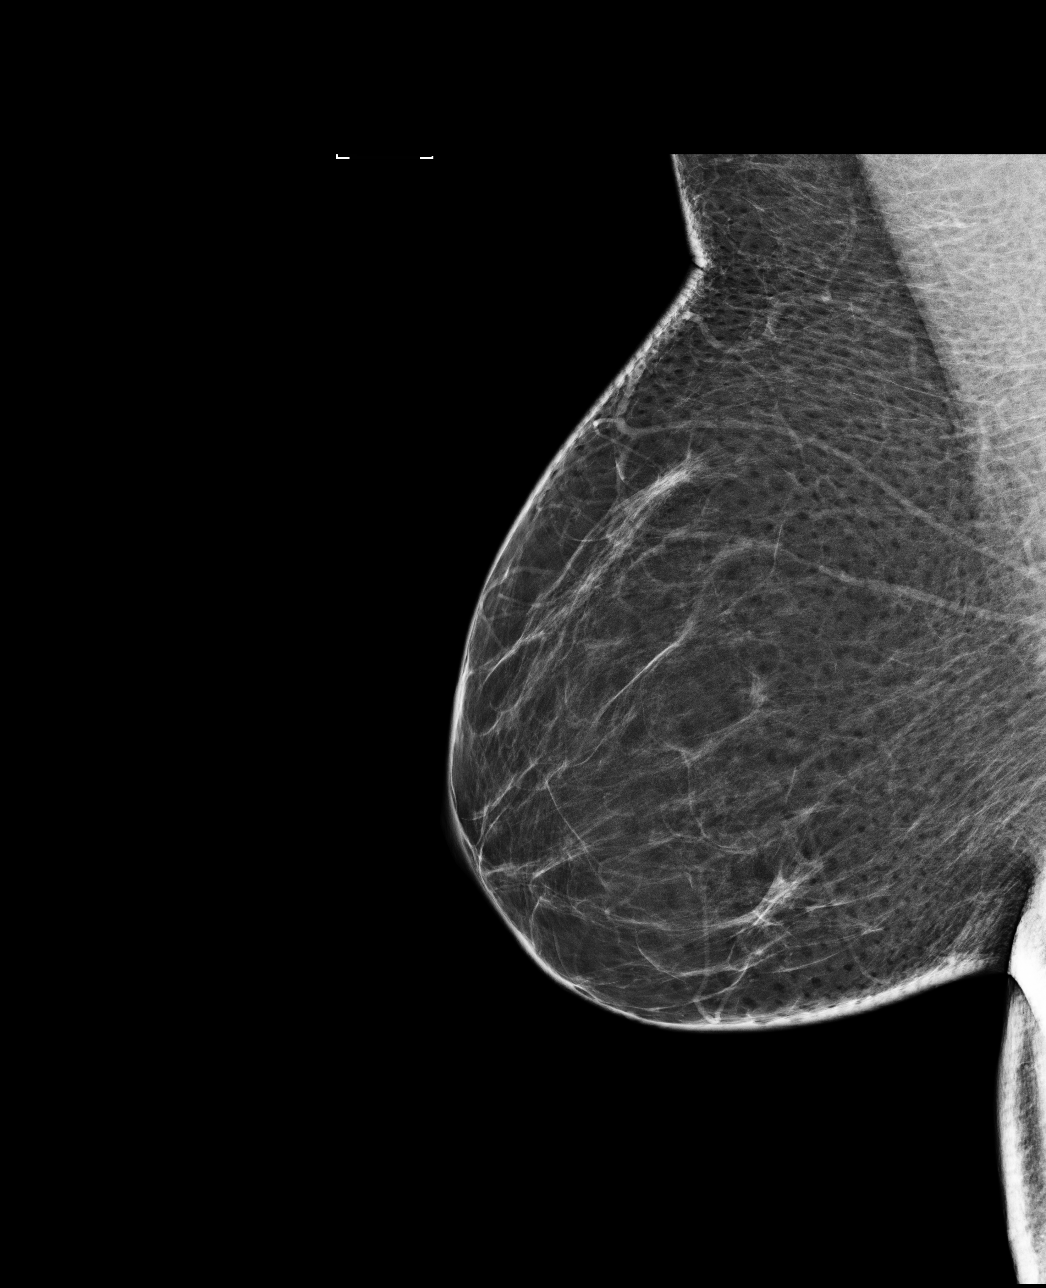

[L CC]
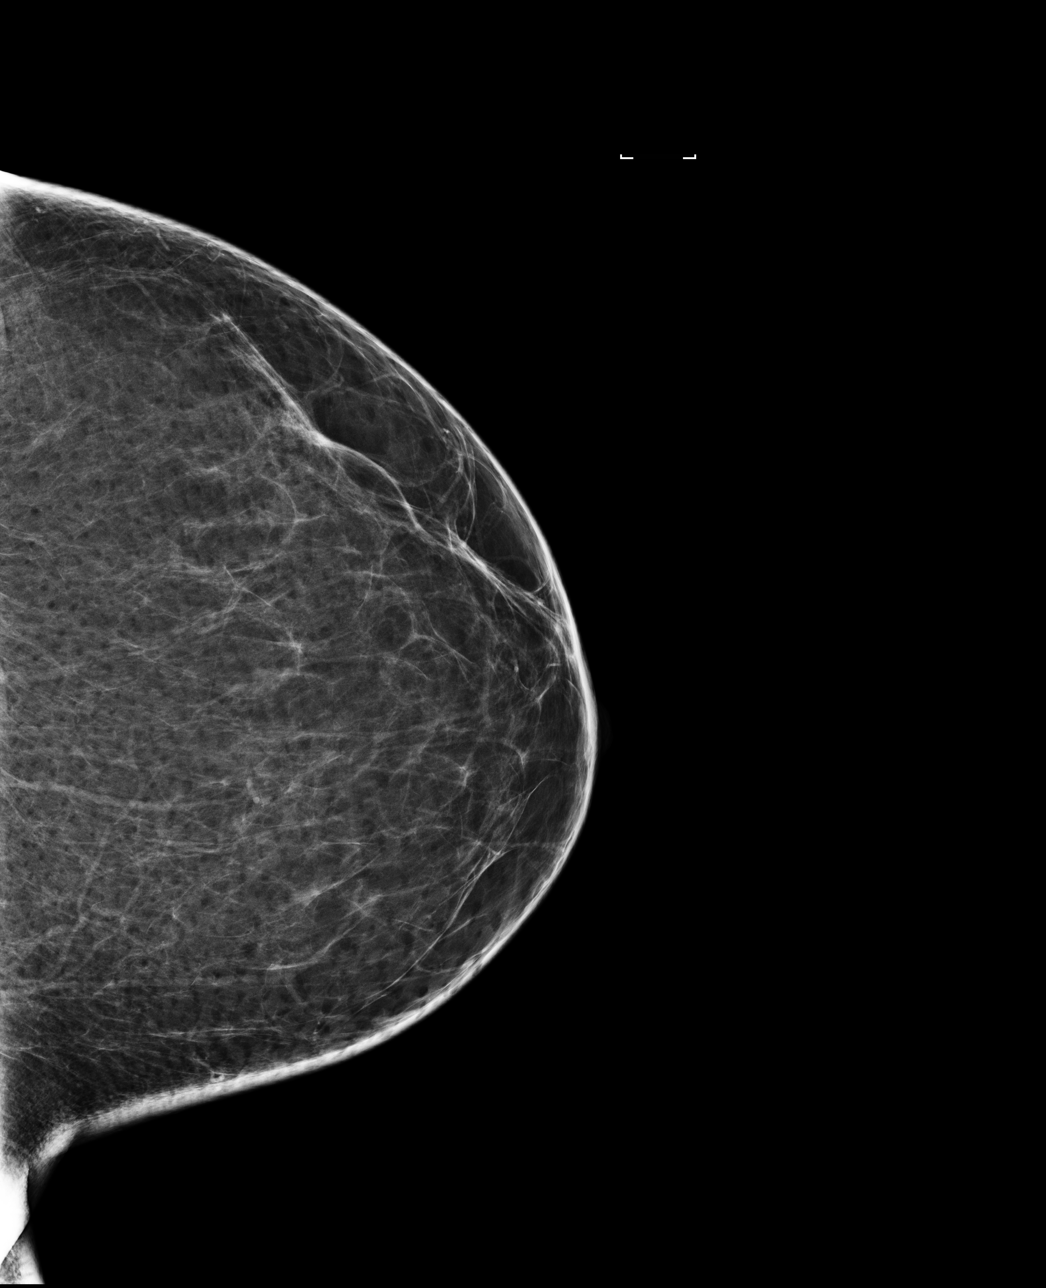

[4 of 4 positions shown; findings below may reference images not displayed]

ACR Breast Density Category b: There are scattered areas of
fibroglandular density.
FINDINGS: There are no findings suspicious for malignancy. Images were
processed with CAD.
IMPRESSION: No mammographic evidence of malignancy. A result letter of this
screening mammogram will be mailed directly to the patient.

RECOMMENDATION:
Screening mammogram in one year. (Code:AS-G-LCT)

BI-RADS CATEGORY  1: Negative.

## 2019-06-25 ENCOUNTER — Telehealth: Payer: Self-pay

## 2019-06-25 MED ORDER — GABAPENTIN 300 MG PO CAPS: 300.0000 mg | ORAL_CAPSULE | Freq: Three times a day (TID) | ORAL | 3 refills | Status: DC

## 2019-06-25 NOTE — Telephone Encounter (Signed)
Left voice mail that a new Rx for Gabapentin 300 mg (1 tid) was sent in for her. Advised her to give [place] a call back with any concerns/questions.

## 2019-06-25 NOTE — Telephone Encounter (Signed)
Patient would like to know if gabapentin can be increased to a different mg.  Stated that Gabapentin, 100mg  is not working.  Cb# [phone].  Please advise.  Thank you.

## 2019-06-25 NOTE — Telephone Encounter (Signed)
New Rx sent.

## 2019-06-25 NOTE — Addendum Note (Signed)
Addended by: Hortencia Pilar on: 06/25/2019 04:25 PM   Modules accepted: Orders

## 2019-06-25 NOTE — Telephone Encounter (Signed)
This message was sent to me.  Thanks.

## 2019-07-04 ENCOUNTER — Ambulatory Visit: Payer: Self-pay

## 2019-07-04 ENCOUNTER — Encounter: Payer: Self-pay | Admitting: Family Medicine

## 2019-07-04 ENCOUNTER — Other Ambulatory Visit: Payer: Self-pay

## 2019-07-04 ENCOUNTER — Ambulatory Visit (INDEPENDENT_AMBULATORY_CARE_PROVIDER_SITE_OTHER): Payer: BLUE CROSS/BLUE SHIELD | Admitting: Family Medicine

## 2019-07-04 DIAGNOSIS — M25562 Pain in left knee: Secondary | ICD-10-CM

## 2019-07-04 DIAGNOSIS — M48061 Spinal stenosis, lumbar region without neurogenic claudication: Secondary | ICD-10-CM

## 2019-07-04 DIAGNOSIS — M25551 Pain in right hip: Secondary | ICD-10-CM | POA: Diagnosis not present

## 2019-07-04 DIAGNOSIS — G8929 Other chronic pain: Secondary | ICD-10-CM | POA: Diagnosis not present

## 2019-07-04 NOTE — Progress Notes (Signed)
Office Visit Note   Patient: Kimberly Combs           Date of Birth: 1955/08/10           MRN: [id number] Visit Date: 07/04/2019 Requested by: Colon Branch, [address] [place],  [place] [address] PCP: Colon Branch, MD  Subjective: Chief Complaint  Patient presents with  . Right Hip - Pain    Lateral hip pain x 2 weeks. Worsening.  . Left Knee - Pain    Continues to have pain in the knee. Constant - even at night - wakes her from sleep.    HPI: She is here with multiple areas of pain.  She has lumbar spinal stenosis and continues to have pain in her back with pain in the legs.  Her left knee now hurts constantly no matter what position she is in.  Recently her right lateral hip has started to hurt primarily with weightbearing.  Lumbar epidural injection unfortunately did not give her much relief.  She is interested in going back to see Dr. Ernestina Patches for possibly another injection in a different area.  He also wonders whether chiropractic decompression might help her and would like a referral.               ROS: No fevers or chills.  All other systems were reviewed and are negative.  Objective: Vital Signs: There were no vitals taken for this visit.  Physical Exam:  General:  Alert and oriented, in no acute distress. Pulm:  Breathing unlabored. Psy:  Normal mood, congruent affect. Skin: No rash Right hip: She is point tender over the greater trochanter.  She has good range of motion and no pain with passive internal rotation. Left knee: No effusion today, no warmth.  Full range of motion with no patellofemoral crepitus.  No significant joint line tenderness.  I cannot reproduce her pain by palpation.  Imaging: XR HIP UNILAT W OR W/O PELVIS 2-3 VIEWS RIGHT  Result Date: 07/04/2019 X-rays right hip reveal well-preserved joint space with no significant arthritic change, no sign of AVN, no stress fracture or neoplasm.   Assessment & Plan: 1.  Chronic back pain with  spinal stenosis -Continue with gabapentin.  Referral to Dr. Jimmye Norman for chiropractic treatment.  Another referral to Dr. Ernestina Patches for epidural injection.  She wants to try everything possible before consulting the surgeon.  2.  Right lateral hip pain, possibly greater trochanter syndrome versus referred pain from lumbar foraminal stenosis. -Greater trochanter injection today.  3.  Chronic left knee pain suspect due to lumbar stenosis -Per patient request we will try a knee injection today.     Procedures: Right hip injection: After sterile prep with Betadine, injected 5 cc 1% lidocaine without epinephrine and 40 mg methylprednisolone into the area of maximum tenderness at the greater trochanter. Left knee injection: After sterile prep with Betadine, injected 5 cc 1% lidocaine without epinephrine and 40 mg methylprednisolone from lateral midpatellar approach.    PMFS History: Patient Active Problem List   Diagnosis Date Noted  . Spinal stenosis of lumbar region 06/08/2019  . Annual physical exam 12/07/2018  . Insomnia 12/01/2017  . RLS (restless legs syndrome) 11/03/2017  . Anxiety 11/03/2017  . Colon polyps 11/03/2017  . PCP NOTES >>>>>>>>>>>>>>>>>>>>>> 06/01/2017  . Diabetes mellitus ([place])   . Hypertension   . Thyroid disease    Past Medical History:  Diagnosis Date  . Diabetes mellitus ([place])   .  Hypertension   . Thyroid disease     Family History  Problem Relation Age of Onset  . Arthritis Mother   . Diabetes Father   . CAD Neg Hx   . Colon cancer Neg Hx   . Breast cancer Neg Hx   . Esophageal cancer Neg Hx   . Stomach cancer Neg Hx     Past Surgical History:  Procedure Laterality Date  . CESAREAN SECTION  [id number]   X2  . COLONOSCOPY  10 years ago    in [place]="normal exam" per pt   Social History   Occupational History  . Occupation: retired - Education officer, museum , [place]  Tobacco Use  . Smoking status: Former Smoker    Quit date: 2000    Years since  quitting: 21.4  . Smokeless tobacco: Never Used  . Tobacco comment: light smoker, quit ~ 2000  Substance and Sexual Activity  . Alcohol use: No  . Drug use: Never  . Sexual activity: Not on file

## 2019-07-05 NOTE — Procedures (Signed)
Lumbar Epidural Steroid Injection - Interlaminar Approach with Fluoroscopic Guidance  Patient: Kimberly Combs      Date of Birth: [birth year] MRN: [id number] PCP: Colon Branch, MD      Visit Date: 01/31/2019   Universal Protocol:     Consent Given By: the patient  Position: PRONE  Additional Comments: Vital signs were monitored before and after the procedure. Patient was prepped and draped in the usual sterile fashion. The correct patient, procedure, and site was verified.   Injection Procedure Details:  Procedure Site One Meds Administered:  Meds ordered this encounter  Medications  . methylPREDNISolone acetate (DEPO-MEDROL) injection 40 mg     Laterality: Left  Location/Site:  L4-L5  Needle size: 20 G  Needle type: Tuohy  Needle Placement: Paramedian epidural  Findings:   -Comments: Excellent flow of contrast into the epidural space.  Procedure Details: Using a paramedian approach from the side mentioned above, the region overlying the inferior lamina was localized under fluoroscopic visualization and the soft tissues overlying this structure were infiltrated with 4 ml. of 1% Lidocaine without Epinephrine. The Tuohy needle was inserted into the epidural space using a paramedian approach.   The epidural space was localized using loss of resistance along with lateral and bi-planar fluoroscopic views.  After negative aspirate for air, blood, and CSF, a 2 ml. volume of Isovue-250 was injected into the epidural space and the flow of contrast was observed. Radiographs were obtained for documentation purposes.    The injectate was administered into the level noted above.   Additional Comments:  The patient tolerated the procedure well Dressing: 2 x 2 sterile gauze and Band-Aid    Post-procedure details: Patient was observed during the procedure. Post-procedure instructions were reviewed.  Patient left the clinic in stable condition.

## 2019-07-05 NOTE — Progress Notes (Signed)
Kimberly Combs - 64 y.o. female MRN [id number]  Date of birth: [birth year]  Office Visit Note: Visit Date: 01/31/2019 PCP: Colon Branch, MD Referred by: Colon Branch, MD  Subjective: Chief Complaint  Patient presents with  . Lower Back - Pain  . Left Leg - Pain   HPI:  Kimberly Combs is a 64 y.o. female who comes in today for planned Left L4-L5 lumbar epidural steroid injection with fluoroscopic guidance.  The patient has failed conservative care including chiropractic care with Dr. Jimmye Norman, home exercise, medications, time and activity modification.  This injection will be diagnostic and hopefully therapeutic.  Please see requesting physician notes for further details and justification.  MRI reviewed with images and spine model.  MRI reviewed in the note below.    ROS Otherwise per HPI.  Assessment & Plan: Visit Diagnoses:  1. Lumbar radiculopathy     Plan: No additional findings.   Meds & Orders:  Meds ordered this encounter  Medications  . methylPREDNISolone acetate (DEPO-MEDROL) injection 40 mg    Orders Placed This Encounter  Procedures  . XR C-ARM NO REPORT  . Epidural Steroid injection    Follow-up: Return if symptoms worsen or fail to improve.   Procedures: No procedures performed  Lumbar Epidural Steroid Injection - Interlaminar Approach with Fluoroscopic Guidance  Patient: Kimberly Combs      Date of Birth: [birth year] MRN: [id number] PCP: Colon Branch, MD      Visit Date: 01/31/2019   Universal Protocol:     Consent Given By: the patient  Position: PRONE  Additional Comments: Vital signs were monitored before and after the procedure. Patient was prepped and draped in the usual sterile fashion. The correct patient, procedure, and site was verified.   Injection Procedure Details:  Procedure Site One Meds Administered:  Meds ordered this encounter  Medications  . methylPREDNISolone acetate (DEPO-MEDROL) injection 40 mg     Laterality:  Left  Location/Site:  L4-L5  Needle size: 20 G  Needle type: Tuohy  Needle Placement: Paramedian epidural  Findings:   -Comments: Excellent flow of contrast into the epidural space.  Procedure Details: Using a paramedian approach from the side mentioned above, the region overlying the inferior lamina was localized under fluoroscopic visualization and the soft tissues overlying this structure were infiltrated with 4 ml. of 1% Lidocaine without Epinephrine. The Tuohy needle was inserted into the epidural space using a paramedian approach.   The epidural space was localized using loss of resistance along with lateral and bi-planar fluoroscopic views.  After negative aspirate for air, blood, and CSF, a 2 ml. volume of Isovue-250 was injected into the epidural space and the flow of contrast was observed. Radiographs were obtained for documentation purposes.    The injectate was administered into the level noted above.   Additional Comments:  The patient tolerated the procedure well Dressing: 2 x 2 sterile gauze and Band-Aid    Post-procedure details: Patient was observed during the procedure. Post-procedure instructions were reviewed.  Patient left the clinic in stable condition.    Clinical History: Acute Interface, Incoming Rad Results - 12/12/2018  9:47 AM EST EXAMINATION: MRI lumbar spine without contrast  CLINICAL INDICATION: Back pain with left-sided sciatica  TECHNIQUE: MRI lumbar spine protocol without contrast.   COMPARISON: None  FINDINGS:  Bone marrow signal: Normal  Conus medullaris and cauda equina: Normal  L1-L2: Normal  L2-L3: There is moderate degenerative disc disease with a mild disc bulge. There is bilateral facet  arthropathy with facet enlargement. There is moderate spinal stenosis.  L3-L4: There is moderate degenerative disc disease with a disc bulge and central disc protrusion. There is facet arthropathy with facet enlargement. This results in  moderate to severe spinal stenosis.  L4-L5: There is mild degenerative disc disease with a disc bulge that extends more to the right. There is facet arthropathy with facet enlargement. The disc is causing moderate stenosis of the right lateral recess.  L5-S1: Normal   IMPRESSION:  1. There is a possible area of endometrial thickening. Correlate with pelvic ultrasound. 2. Moderate spinal stenosis at L2-L3 due to disc bulge and facet arthropathy 3. Moderate to severe spinal stenosis at L3-L4 due to disc bulge and facet arthropathy 4. Mild degenerative disc disease at L4-L5 with disc bulge that extends more to the right. There is facet arthropathy with facet enlargement and moderate stenosis of the right lateral recess.  ####CODE SIGNIFICANT REPORT#### possible endometrial abnormality, correlate with pelvic ultrasound  Electronically Signed by: Michael Boston     Objective:  VS:  HT:    WT:   BMI:     BP:(!) 148/84  HR:74bpm  TEMP: ( )  RESP:  Physical Exam Constitutional:      General: She is not in acute distress.    Appearance: Normal appearance. She is not ill-appearing.  HENT:     Head: Normocephalic and atraumatic.     Right Ear: External ear normal.     Left Ear: External ear normal.  Eyes:     Extraocular Movements: Extraocular movements intact.  Cardiovascular:     Rate and Rhythm: Normal rate.     Pulses: Normal pulses.  Musculoskeletal:     Right lower leg: No edema.     Left lower leg: No edema.     Comments: Patient has good distal strength with no pain over the greater trochanters.  No clonus or focal weakness.  Skin:    Findings: No erythema, lesion or rash.  Neurological:     General: No focal deficit present.     Mental Status: She is alert and oriented to person, place, and time.     Sensory: No sensory deficit.     Motor: No weakness or abnormal muscle tone.     Coordination: Coordination normal.  Psychiatric:        Mood and Affect: Mood normal.         Behavior: Behavior normal.      Imaging: XR HIP UNILAT W OR W/O PELVIS 2-3 VIEWS RIGHT  Result Date: 07/04/2019 X-rays right hip reveal well-preserved joint space with no significant arthritic change, no sign of AVN, no stress fracture or neoplasm.

## 2019-07-09 ENCOUNTER — Telehealth: Payer: Self-pay | Admitting: Internal Medicine

## 2019-07-09 NOTE — Telephone Encounter (Signed)
Caller Monik Lins  Call Back @ [phone]  pls mail lab results to address listed on patient account.

## 2019-07-09 NOTE — Telephone Encounter (Signed)
Results were mailed on 06/08/19 however will mail again.

## 2019-07-13 ENCOUNTER — Telehealth: Payer: Self-pay

## 2019-07-13 DIAGNOSIS — M545 Low back pain, unspecified: Secondary | ICD-10-CM

## 2019-07-13 DIAGNOSIS — M5442 Lumbago with sciatica, left side: Secondary | ICD-10-CM

## 2019-07-13 DIAGNOSIS — M48061 Spinal stenosis, lumbar region without neurogenic claudication: Secondary | ICD-10-CM

## 2019-07-13 NOTE — Telephone Encounter (Signed)
Per Dr. Junius Roads since pt's ESI cant be done by Dr. Ernestina Patches per insurance denial. Need to refer pt to [facility] imaging.    New referral was placed in chart

## 2019-07-17 ENCOUNTER — Other Ambulatory Visit: Payer: Self-pay | Admitting: Family Medicine

## 2019-07-17 DIAGNOSIS — M48061 Spinal stenosis, lumbar region without neurogenic claudication: Secondary | ICD-10-CM

## 2019-07-17 NOTE — Telephone Encounter (Signed)
I called Angelita Ingles and left message on vm advising order in epic.

## 2019-07-19 ENCOUNTER — Other Ambulatory Visit: Payer: Self-pay | Admitting: Internal Medicine

## 2019-07-23 ENCOUNTER — Other Ambulatory Visit: Payer: Self-pay | Admitting: Internal Medicine

## 2019-07-24 ENCOUNTER — Telehealth: Payer: Self-pay

## 2019-07-24 NOTE — Telephone Encounter (Signed)
Anderson Malta with [facility] called and stated pt's insurance requires auth to be done by providers office for [facility]. Can you check on this referral and let Dr. Junius Roads team know what needs to be done. Thank you  [facility] CB # [phone]

## 2019-07-25 NOTE — Telephone Encounter (Signed)
Does [facility] do their own auths for ESI's? If so, who should patient reach out to?

## 2019-07-25 NOTE — Telephone Encounter (Signed)
Can you please advise in what I need to do with this?

## 2019-07-25 NOTE — Telephone Encounter (Signed)
Dont worry about this, I am taking care of it, usually we dont do these the facility that is doing them does, but her insurance is requiring ordering office to get PA.

## 2019-07-25 NOTE — Telephone Encounter (Signed)
I dont do the ESI injections authorizations

## 2019-07-30 ENCOUNTER — Inpatient Hospital Stay: Admission: RE | Admit: 2019-07-30 | Payer: BLUE CROSS/BLUE SHIELD | Source: Ambulatory Visit

## 2019-08-15 ENCOUNTER — Encounter: Payer: Self-pay | Admitting: Internal Medicine

## 2019-08-31 ENCOUNTER — Other Ambulatory Visit: Payer: Self-pay | Admitting: Internal Medicine

## 2019-09-18 ENCOUNTER — Other Ambulatory Visit: Payer: Self-pay | Admitting: Obstetrics & Gynecology

## 2019-09-18 DIAGNOSIS — Z1231 Encounter for screening mammogram for malignant neoplasm of breast: Secondary | ICD-10-CM

## 2019-10-02 ENCOUNTER — Other Ambulatory Visit: Payer: Self-pay

## 2019-10-02 ENCOUNTER — Ambulatory Visit
Admission: RE | Admit: 2019-10-02 | Discharge: 2019-10-02 | Disposition: A | Discharge status: Home / Self Care | Payer: BLUE CROSS/BLUE SHIELD | Source: Ambulatory Visit | Attending: Obstetrics & Gynecology | Admitting: Obstetrics & Gynecology

## 2019-10-02 DIAGNOSIS — Z1231 Encounter for screening mammogram for malignant neoplasm of breast: Secondary | ICD-10-CM | POA: Diagnosis not present

## 2019-10-10 ENCOUNTER — Ambulatory Visit: Payer: BLUE CROSS/BLUE SHIELD | Admitting: Internal Medicine

## 2019-10-16 ENCOUNTER — Other Ambulatory Visit: Payer: Self-pay | Admitting: Family Medicine

## 2019-11-15 ENCOUNTER — Other Ambulatory Visit: Payer: Self-pay | Admitting: Internal Medicine

## 2019-11-19 ENCOUNTER — Ambulatory Visit: Payer: BLUE CROSS/BLUE SHIELD | Admitting: Internal Medicine

## 2019-11-19 ENCOUNTER — Encounter: Payer: Self-pay | Admitting: Internal Medicine

## 2019-11-19 ENCOUNTER — Other Ambulatory Visit: Payer: Self-pay

## 2019-11-19 VITALS — BP 136/80 | HR 73 | Temp 98.1°F | Resp 16 | Ht 65.0 in | Wt 184.5 lb

## 2019-11-19 DIAGNOSIS — I10 Essential (primary) hypertension: Secondary | ICD-10-CM | POA: Diagnosis not present

## 2019-11-19 DIAGNOSIS — E119 Type 2 diabetes mellitus without complications: Secondary | ICD-10-CM | POA: Diagnosis not present

## 2019-11-19 DIAGNOSIS — Z23 Encounter for immunization: Secondary | ICD-10-CM

## 2019-11-19 DIAGNOSIS — Z780 Asymptomatic menopausal state: Secondary | ICD-10-CM | POA: Diagnosis not present

## 2019-11-19 DIAGNOSIS — M48061 Spinal stenosis, lumbar region without neurogenic claudication: Secondary | ICD-10-CM

## 2019-11-19 DIAGNOSIS — E079 Disorder of thyroid, unspecified: Secondary | ICD-10-CM

## 2019-11-19 NOTE — Progress Notes (Signed)
Subjective:    Patient ID: Kimberly Combs, female    DOB: June 01, 1955, 64 y.o.   MRN: [id number]  DOS:  11/19/2019 Type of visit - description: Routine checkup In general feeling well. Today we talk about diabetes, hypothyroidism, hypertension.  Recent labs reviewed.  Likes her TSH checked.  Wt Readings from Last 3 Encounters:  11/19/19 184 lb 8 oz (83.7 kg)  06/07/19 182 lb 4 oz (82.7 kg)  12/07/18 188 lb 4 oz (85.4 kg)     Review of Systems Back pain at baseline She has been more active, has lost some weight on her own scales, ambulatory CBGs reviewed.  Past Medical History:  Diagnosis Date  . Diabetes mellitus ([place])   . Hypertension   . Thyroid disease     Past Surgical History:  Procedure Laterality Date  . CESAREAN SECTION  9191,6606   X2  . COLONOSCOPY  10 years ago    in [place]="normal exam" per pt    Allergies as of 11/19/2019   No Known Allergies     Medication List       Accurate as of November 19, 2019 11:59 PM. If you have any questions, ask your nurse or doctor.        STOP taking these medications   augmented betamethasone dipropionate 0.05 % cream Commonly known as: DIPROLENE-AF Stopped by: Kathlene November, MD   gabapentin 300 MG capsule Commonly known as: NEURONTIN Stopped by: Kathlene November, MD     TAKE these medications   b complex vitamins tablet Take 1 tablet by mouth daily.   cyanocobalamin 1000 MCG/ML injection Commonly known as: (VITAMIN B-12) Inject 1 mL (1,000 mcg total) into the skin daily.   losartan 50 MG tablet Commonly known as: COZAAR Take 50 mg by mouth daily.   metFORMIN 750 MG 24 hr tablet Commonly known as: GLUCOPHAGE-XR TAKE 2 TABLETS BY MOUTH EVERY MORNING AND 1 TABLET EVERY EVENING   ONE TOUCH LANCETS Misc Check blood sugar three times daily   OneTouch Verio Flex System w/Device Kit Check blood sugar 3 times daily   OneTouch Verio test strip Generic drug: glucose blood CHECK BLOOD SUGAR THREE TIMES DAILY EVERY DAY    Synthroid 125 MCG tablet Generic drug: levothyroxine Take 1 tablet (125 mcg total) by mouth daily before breakfast.          Objective:   Physical Exam BP 136/80 (BP Location: Right Arm, Patient Position: Sitting, Cuff Size: Normal)   Pulse 73   Temp 98.1 F (36.7 C) (Oral)   Resp 16   Ht _0  (1.651 m)   Wt 184 lb 8 oz (83.7 kg)   SpO2 100%   BMI 30.70 kg/m  General:   Well developed, NAD, BMI noted. HEENT:  Normocephalic . Face symmetric, atraumatic Lungs:  CTA B Normal respiratory effort, no intercostal retractions, no accessory muscle use. Heart: RRR,  no murmur.  DM foot exam: No edema, good pedal pulses, pinprick examination normal Skin: Not pale. Not jaundice Neurologic:  alert & oriented X3.  Speech normal, gait appropriate for age and unassisted Psych--  Cognition and judgment appear intact.  Cooperative with normal attention span and concentration.  Behavior appropriate. No anxious or depressed appearing.      Assessment    Assessment (this is new patient 05/2017, used to see Dr. Delfina Redwood, this office is more conveniently located) DM: dx ~ 2014 HTN: dx ~ 2016 Anxiety, insomnia RLS, periodic limb movement per neuro 2019 Hypothyroidism dx remotely  MSK: Back pain, had MRI, DX lumbar stenosis (ortho OV 01-2019)  PLAN DM: Last A1c was 7.5.  On Metformin, ambulatory CBGs range from 98-130.  Doing better with physical activity and has lost some weight on her scales.  Check A1c. HTN: On losartan, ambulatory BPs normal, check a BMP and CBC Hypothyroidism: On Synthroid, last TSH normal, request to be rechecked.  Will do. Dyslipidemia, last LDL 81, on no meds, at some point will need to start statins.  Discussed on RTC Increase LFTs: LFTs normal 06/2019, hep C screening negative at the time Lumbar stenosis: saw ortho sine last OV, sxs about the same , elected to treat w/ OTCs,   stretching, and is thinking about acupuncture  Preventive care:  Had Covid  vaccinations 07/2019, recommend booster in few weeks. Due for a bone density.  Flu shot today. RTC 4 months CPX    This visit occurred during the SARS-CoV-2 public health emergency.  Safety protocols were in place, including screening questions prior to the visit, additional usage of staff PPE, and extensive cleaning of exam room while observing appropriate contact time as indicated for disinfecting solutions.

## 2019-11-19 NOTE — Progress Notes (Signed)
Pre visit review using our clinic review tool, if applicable. No additional management support is needed unless otherwise documented below in the visit note. 

## 2019-11-19 NOTE — Patient Instructions (Addendum)
Check the  blood pressure 2 or 3 times a month  BP GOAL is between 110/65 and  135/85. If it is consistently higher or lower, let me know     GO TO THE LAB : Get the blood work     [place], [place] back for   a physical exam in 4 months, come back fasting   STOP BY THE FIRST FLOOR: Schedule your bone density test

## 2019-11-20 LAB — HEMOGLOBIN A1C
Hgb A1c MFr Bld: 6.7 % of total Hgb — ABNORMAL HIGH (ref <5.7)
Mean Plasma Glucose: 146 (calc)
eAG (mmol/L): 8.1 (calc)

## 2019-11-20 LAB — CBC WITH DIFFERENTIAL/PLATELET
Absolute Monocytes: 389 cells/uL (ref 200–950)
Basophils Absolute: 43 cells/uL (ref 0–200)
Basophils Relative: 0.6 %
Eosinophils Absolute: 108 cells/uL (ref 15–500)
Eosinophils Relative: 1.5 %
HCT: 38.4 % (ref 35.0–45.0)
Hemoglobin: 12.7 g/dL (ref 11.7–15.5)
Lymphs Abs: 2304 cells/uL (ref 850–3900)
MCH: 27 pg (ref 27.0–33.0)
MCHC: 33.1 g/dL (ref 32.0–36.0)
MCV: 81.7 fL (ref 80.0–100.0)
MPV: 10.3 fL (ref 7.5–12.5)
Monocytes Relative: 5.4 %
Neutro Abs: 4356 cells/uL (ref 1500–7800)
Neutrophils Relative %: 60.5 %
Platelets: 306 10*3/uL (ref 140–400)
RBC: 4.7 10*6/uL (ref 3.80–5.10)
RDW: 13.5 % (ref 11.0–15.0)
Total Lymphocyte: 32 %
WBC: 7.2 10*3/uL (ref 3.8–10.8)

## 2019-11-20 LAB — BASIC METABOLIC PANEL
BUN: 10 mg/dL (ref 7–25)
CO2: 29 mmol/L (ref 20–32)
Calcium: 9.8 mg/dL (ref 8.6–10.4)
Chloride: 102 mmol/L (ref 98–110)
Creat: 0.73 mg/dL (ref 0.50–0.99)
Glucose, Bld: 120 mg/dL — ABNORMAL HIGH (ref 65–99)
Potassium: 4.2 mmol/L (ref 3.5–5.3)
Sodium: 139 mmol/L (ref 135–146)

## 2019-11-20 LAB — TSH: TSH: 3.24 mIU/L (ref 0.40–4.50)

## 2019-11-20 NOTE — Assessment & Plan Note (Addendum)
DM: Last A1c was 7.5.  On Metformin, ambulatory CBGs range from 98-130.  Doing better with physical activity and has lost some weight on her scales.  Check A1c. HTN: On losartan, ambulatory BPs normal, check a BMP and CBC Hypothyroidism: On Synthroid, last TSH normal, request to be rechecked.  Will do. Dyslipidemia, last LDL 81, on no meds, at some point will need to start statins.  Discussed on RTC Increase LFTs: LFTs normal 06/2019, hep C screening negative at the time Lumbar stenosis: saw ortho sine last OV, sxs about the same , elected to treat w/ OTCs,   stretching, and is thinking about acupuncture  Preventive care:  Had Covid vaccinations 07/2019, recommend booster in few weeks. Due for a bone density.  Flu shot today. RTC 4 months CPX

## 2019-11-21 DIAGNOSIS — Z01419 Encounter for gynecological examination (general) (routine) without abnormal findings: Secondary | ICD-10-CM | POA: Diagnosis not present

## 2019-12-03 ENCOUNTER — Other Ambulatory Visit: Payer: Self-pay

## 2019-12-03 ENCOUNTER — Ambulatory Visit (HOSPITAL_BASED_OUTPATIENT_CLINIC_OR_DEPARTMENT_OTHER)
Admission: RE | Admit: 2019-12-03 | Discharge: 2019-12-03 | Disposition: A | Discharge status: Home / Self Care | Payer: BLUE CROSS/BLUE SHIELD | Source: Ambulatory Visit | Attending: Internal Medicine | Admitting: Internal Medicine

## 2019-12-03 DIAGNOSIS — Z780 Asymptomatic menopausal state: Secondary | ICD-10-CM | POA: Diagnosis not present

## 2019-12-03 DIAGNOSIS — Z13820 Encounter for screening for osteoporosis: Secondary | ICD-10-CM | POA: Diagnosis not present

## 2019-12-03 DIAGNOSIS — E039 Hypothyroidism, unspecified: Secondary | ICD-10-CM | POA: Diagnosis not present

## 2019-12-03 DIAGNOSIS — Z8739 Personal history of other diseases of the musculoskeletal system and connective tissue: Secondary | ICD-10-CM | POA: Diagnosis not present

## 2020-02-12 DIAGNOSIS — H02831 Dermatochalasis of right upper eyelid: Secondary | ICD-10-CM | POA: Diagnosis not present

## 2020-02-12 DIAGNOSIS — Z7984 Long term (current) use of oral hypoglycemic drugs: Secondary | ICD-10-CM | POA: Diagnosis not present

## 2020-02-12 DIAGNOSIS — E119 Type 2 diabetes mellitus without complications: Secondary | ICD-10-CM | POA: Diagnosis not present

## 2020-02-12 DIAGNOSIS — H2513 Age-related nuclear cataract, bilateral: Secondary | ICD-10-CM | POA: Diagnosis not present

## 2020-02-12 LAB — HM DIABETES EYE EXAM

## 2020-03-01 ENCOUNTER — Other Ambulatory Visit: Payer: Self-pay | Admitting: Internal Medicine

## 2020-03-13 ENCOUNTER — Encounter: Payer: Self-pay | Admitting: Internal Medicine

## 2020-03-21 ENCOUNTER — Encounter: Payer: BLUE CROSS/BLUE SHIELD | Admitting: Internal Medicine

## 2020-03-31 ENCOUNTER — Other Ambulatory Visit: Payer: Self-pay | Admitting: Internal Medicine

## 2020-04-28 DIAGNOSIS — S00412A Abrasion of left ear, initial encounter: Secondary | ICD-10-CM | POA: Diagnosis not present

## 2020-05-06 ENCOUNTER — Encounter: Payer: BLUE CROSS/BLUE SHIELD | Admitting: Internal Medicine

## 2020-06-03 ENCOUNTER — Encounter: Payer: Self-pay | Admitting: Internal Medicine

## 2020-06-03 ENCOUNTER — Other Ambulatory Visit: Payer: Self-pay

## 2020-06-03 ENCOUNTER — Ambulatory Visit (INDEPENDENT_AMBULATORY_CARE_PROVIDER_SITE_OTHER): Payer: BLUE CROSS/BLUE SHIELD | Admitting: Internal Medicine

## 2020-06-03 ENCOUNTER — Ambulatory Visit: Payer: BLUE CROSS/BLUE SHIELD | Attending: Internal Medicine

## 2020-06-03 VITALS — BP 168/74 | HR 69 | Temp 98.1°F | Resp 16 | Ht 65.0 in | Wt 180.1 lb

## 2020-06-03 DIAGNOSIS — E079 Disorder of thyroid, unspecified: Secondary | ICD-10-CM | POA: Diagnosis not present

## 2020-06-03 DIAGNOSIS — E119 Type 2 diabetes mellitus without complications: Secondary | ICD-10-CM | POA: Diagnosis not present

## 2020-06-03 DIAGNOSIS — I10 Essential (primary) hypertension: Secondary | ICD-10-CM

## 2020-06-03 DIAGNOSIS — Z0000 Encounter for general adult medical examination without abnormal findings: Secondary | ICD-10-CM | POA: Diagnosis not present

## 2020-06-03 DIAGNOSIS — Z23 Encounter for immunization: Secondary | ICD-10-CM

## 2020-06-03 LAB — HEMOGLOBIN A1C: Hgb A1c MFr Bld: 6.8 % — ABNORMAL HIGH (ref 4.6–6.5)

## 2020-06-03 LAB — COMPREHENSIVE METABOLIC PANEL
ALT: 26 U/L (ref 0–35)
AST: 20 U/L (ref 0–37)
Albumin: 4.5 g/dL (ref 3.5–5.2)
Alkaline Phosphatase: 70 U/L (ref 39–117)
BUN: 10 mg/dL (ref 6–23)
CO2: 30 mEq/L (ref 19–32)
Calcium: 10.1 mg/dL (ref 8.4–10.5)
Chloride: 101 mEq/L (ref 96–112)
Creatinine, Ser: 0.72 mg/dL (ref 0.40–1.20)
GFR: 88.1 mL/min (ref >60.00)
Glucose, Bld: 122 mg/dL — ABNORMAL HIGH (ref 70–99)
Potassium: 4.1 mEq/L (ref 3.5–5.1)
Sodium: 139 mEq/L (ref 135–145)
Total Bilirubin: 0.6 mg/dL (ref 0.2–1.2)
Total Protein: 7.5 g/dL (ref 6.0–8.3)

## 2020-06-03 LAB — LIPID PANEL
Cholesterol: 180 mg/dL (ref 0–200)
HDL: 66.7 mg/dL (ref >39.00)
LDL Cholesterol: 93 mg/dL (ref 0–99)
NonHDL: 112.88
Total CHOL/HDL Ratio: 3
Triglycerides: 99 mg/dL (ref 0.0–149.0)
VLDL: 19.8 mg/dL (ref 0.0–40.0)

## 2020-06-03 LAB — TSH: TSH: 1.5 u[IU]/mL (ref 0.35–4.50)

## 2020-06-03 NOTE — Assessment & Plan Note (Signed)
Here for CPX DM: Doing great with diet and exercise, ambulatory CBGs around 100, continue metformin, check lab. Dyslipidemia: Last LDL 81, aware that patients w/ DM are rec to start statins.  HTN: On losartan, BP today elevated, at home is ~ 136/70s-80s.  No change for now, low-salt diet encouraged Hypothyroidism: Check TSH, continue Synthroid B12: At some point took injections now on orals, no history of deficiency. RTC 4 months

## 2020-06-03 NOTE — Progress Notes (Signed)
   Covid-19 Vaccination Clinic  Name:  Kimberly Combs    MRN: [id number] DOB: March 18, 1955  06/03/2020  [name] was observed post Covid-19 immunization for 15 minutes without incident. She was provided with Vaccine Information Sheet and instruction to access the V-Safe system.   [name] was instructed to call 911 with any severe reactions post vaccine: Marland Kitchen Difficulty breathing  . Swelling of face and throat  . A fast heartbeat  . A bad rash all over body  . Dizziness and weakness   Immunizations Administered    Name Date Dose VIS Date Route   Moderna Covid-19 Booster Vaccine 06/03/2020 10:16 AM 0.25 mL 11/21/2019 Intramuscular   Manufacturer: Moderna   Lot: [id number]   [place]: 82500-370-48

## 2020-06-03 NOTE — Progress Notes (Signed)
Subjective:    Patient ID: Kimberly Combs, female    DOB: [birth year], 65 y.o.   MRN: [id number]  DOS:  06/03/2020 Type of visit - description: Here for a physical exam   Since the last office visit she is doing well. Meds reviewed, good compliance.   BP Readings from Last 3 Encounters:  06/03/20 (!) 168/74  11/19/19 136/80  06/07/19 139/77     Review of Systems  A 14 point review of systems is negative    Past Medical History:  Diagnosis Date  . Diabetes mellitus ([place])   . Hypertension   . Thyroid disease     Past Surgical History:  Procedure Laterality Date  . CESAREAN SECTION  [id number]   X2  . COLONOSCOPY  10 years ago    in [place]="normal exam" per pt    Social History   Socioeconomic History  . Marital status: Married    Spouse name: Ray  . Number of children: 2  . Years of education: Not on file  . Highest education level: Bachelor's degree (e.g., BA, AB, BS)  Occupational History  . Occupation: retired - Education officer, museum , [place]  Tobacco Use  . Smoking status: Former Smoker    Quit date: 2000    Years since quitting: 22.3  . Smokeless tobacco: Never Used  . Tobacco comment: light smoker, quit ~ 2000  Vaping Use  . Vaping Use: Never used  Substance and Sexual Activity  . Alcohol use: No  . Drug use: Never  . Sexual activity: Not on file  Other Topics Concern  . Not on file  Social History Narrative   Born in [place]   Moved from [place] to [place] ~ 2015      Patient is right-handed. She lives with her husband in a 2 story house. She drinks one cup of deca coffee most days. She and her husband walk occasionally.   Social Determinants of Health   Financial Resource Strain: Not on file  Food Insecurity: Not on file  Transportation Needs: Not on file  Physical Activity: Not on file  Stress: Not on file  Social Connections: Not on file  Intimate Partner Violence: Not on file    Family History  Problem Relation Age of Onset  . Arthritis Mother   .  Diabetes Father   . CAD Neg Hx   . Colon cancer Neg Hx   . Breast cancer Neg Hx   . Esophageal cancer Neg Hx   . Stomach cancer Neg Hx      Allergies as of 06/03/2020   No Known Allergies     Medication List       Accurate as of Jun 03, 2020  6:09 PM. If you have any questions, ask your nurse or doctor.        STOP taking these medications   cyanocobalamin 1000 MCG/ML injection Commonly known as: (VITAMIN B-12) Stopped by: Kathlene November, MD     TAKE these medications   b complex vitamins tablet Take 1 tablet by mouth daily.   losartan 50 MG tablet Commonly known as: COZAAR Take 50 mg by mouth daily.   metFORMIN 750 MG 24 hr tablet Commonly known as: GLUCOPHAGE-XR Take 2 tablets (1,500 mg total) by mouth in the morning AND 1 tablet (750 mg total) every evening.   ONE TOUCH LANCETS Misc Check blood sugar three times daily   OneTouch Verio Flex System w/Device Kit Check blood sugar 3 times daily   OneTouch  Verio test strip Generic drug: glucose blood CHECK BLOOD SUGAR THREE TIMES DAILY EVERY DAY   Synthroid 125 MCG tablet Generic drug: levothyroxine Take 1 tablet (125 mcg total) by mouth daily before breakfast.          Objective:   Physical Exam BP (!) 168/74 (BP Location: Left Arm, Patient Position: Sitting, Cuff Size: Small)   Pulse 69   Temp 98.1 F (36.7 C) (Oral)   Resp 16   Ht '5\' 5"'  (1.651 m)   Wt 180 lb 2 oz (81.7 kg)   SpO2 98%   BMI 29.97 kg/m  General: Well developed, NAD, BMI noted Neck: No  thyromegaly  HEENT:  Normocephalic . Face symmetric, atraumatic Lungs:  CTA B Normal respiratory effort, no intercostal retractions, no accessory muscle use. Heart: RRR,  no murmur.  Abdomen:  Not distended, soft, non-tender. No rebound or rigidity.   Lower extremities: no pretibial edema bilaterally  Skin: Exposed areas without rash. Not pale. Not jaundice Neurologic:  alert & oriented X3.  Speech normal, gait appropriate for age and  unassisted Strength symmetric and appropriate for age.  Psych: Cognition and judgment appear intact.  Cooperative with normal attention span and concentration.  Behavior appropriate. No anxious or depressed appearing.     Assessment      Assessment (New patient 05/2017, used to see Dr. Delfina Redwood) DM: dx ~ 2014 HTN: dx ~ 2016 Anxiety, insomnia RLS, periodic limb movement per neuro 2019 Hypothyroidism dx remotely  MSK: Back pain, had MRI, DX lumbar stenosis (ortho OV 01-2019)  PLAN Here for CPX DM: Doing great with diet and exercise, ambulatory CBGs around 100, continue metformin, check lab. Dyslipidemia: Last LDL 81, aware that patients w/ DM are rec to start statins.  HTN: On losartan, BP today elevated, at home is ~ 136/70s-80s.  No change for now, low-salt diet encouraged Hypothyroidism: Check TSH, continue Synthroid B12: At some point took injections now on orals, no history of deficiency. RTC 4 months   This visit occurred during the SARS-CoV-2 public health emergency.  Safety protocols were in place, including screening questions prior to the visit, additional usage of staff PPE, and extensive cleaning of exam room while observing appropriate contact time as indicated for disinfecting solutions.

## 2020-06-03 NOTE — Patient Instructions (Addendum)
Check the  blood pressure  BP GOAL is between 110/65 and  135/85. If it is consistently higher or lower, let me know    GO TO THE LAB : Get the blood work     [place], [place] back for   a checkup in 4 months    DASH Eating Plan DASH stands for Dietary Approaches to Stop Hypertension. The DASH eating plan is a healthy eating plan that has been shown to:  Reduce high blood pressure (hypertension).  Reduce your risk for type 2 diabetes, heart disease, and stroke.  Help with weight loss. What are tips for following this plan? Reading food labels  Check food labels for the amount of salt (sodium) per serving. Choose foods with less than 5 percent of the Daily Value of sodium. Generally, foods with less than 300 milligrams (mg) of sodium per serving fit into this eating plan.  To find whole grains, look for the word "whole" as the first word in the ingredient list. Shopping  Buy products labeled as "low-sodium" or "no salt added."  Buy fresh foods. Avoid canned foods and pre-made or frozen meals. Cooking  Avoid adding salt when cooking. Use salt-free seasonings or herbs instead of table salt or sea salt. Check with your health care provider or pharmacist before using salt substitutes.  Do not fry foods. Cook foods using healthy methods such as baking, boiling, grilling, roasting, and broiling instead.  Cook with heart-healthy oils, such as olive, canola, avocado, soybean, or sunflower oil. Meal planning  Eat a balanced diet that includes: ? 4 or more servings of fruits and 4 or more servings of vegetables each day. Try to fill one-half of your plate with fruits and vegetables. ? 6-8 servings of whole grains each day. ? Less than 6 oz (170 g) of lean meat, poultry, or fish each day. A 3-oz (85-g) serving of meat is about the same size as a deck of cards. One egg equals 1 oz (28 g). ? 2-3 servings of low-fat dairy each day. One serving  is 1 cup (237 mL). ? 1 serving of nuts, seeds, or beans 5 times each week. ? 2-3 servings of heart-healthy fats. Healthy fats called omega-3 fatty acids are found in foods such as walnuts, flaxseeds, fortified milks, and eggs. These fats are also found in cold-water fish, such as sardines, salmon, and mackerel.  Limit how much you eat of: ? Canned or prepackaged foods. ? Food that is high in trans fat, such as some fried foods. ? Food that is high in saturated fat, such as fatty meat. ? Desserts and other sweets, sugary drinks, and other foods with added sugar. ? Full-fat dairy products.  Do not salt foods before eating.  Do not eat more than 4 egg yolks a week.  Try to eat at least 2 vegetarian meals a week.  Eat more home-cooked food and less restaurant, buffet, and fast food.   Lifestyle  When eating at a restaurant, ask that your food be prepared with less salt or no salt, if possible.  If you drink alcohol: ? Limit how much you use to:  0-1 drink a day for women who are not pregnant.  0-2 drinks a day for men. ? Be aware of how much alcohol is in your drink. In the [place] one drink equals one 12 oz bottle of beer (355 mL), one 5 oz glass of wine (148 mL), or one 1 oz  glass of hard liquor (44 mL). General information  Avoid eating more than 2,300 mg of salt a day. If you have hypertension, you may need to reduce your sodium intake to 1,500 mg a day.  Work with your health care provider to maintain a healthy body weight or to lose weight. Ask what an ideal weight is for you.  Get at least 30 minutes of exercise that causes your heart to beat faster (aerobic exercise) most days of the week. Activities may include walking, swimming, or biking.  Work with your health care provider or dietitian to adjust your eating plan to your individual calorie needs. What foods should I eat? Fruits All fresh, dried, or frozen fruit. Canned fruit in natural juice (without added  sugar). Vegetables Fresh or frozen vegetables (raw, steamed, roasted, or grilled). Low-sodium or reduced-sodium tomato and vegetable juice. Low-sodium or reduced-sodium tomato sauce and tomato paste. Low-sodium or reduced-sodium canned vegetables. Grains Whole-grain or whole-wheat bread. Whole-grain or whole-wheat pasta. Brown rice. [place]ena Morrow. Bulgur. Whole-grain and low-sodium cereals. Pita bread. Low-fat, low-sodium crackers. Whole-wheat flour tortillas. Meats and other proteins Skinless chicken or [place]. Ground chicken or [place]. Pork with fat trimmed off. Fish and seafood. Egg whites. Dried beans, peas, or lentils. Unsalted nuts, nut butters, and seeds. Unsalted canned beans. Lean cuts of beef with fat trimmed off. Low-sodium, lean precooked or cured meat, such as sausages or meat loaves. Dairy Low-fat (1%) or fat-free (skim) milk. Reduced-fat, low-fat, or fat-free cheeses. Nonfat, low-sodium ricotta or cottage cheese. Low-fat or nonfat yogurt. Low-fat, low-sodium cheese. Fats and oils Soft margarine without trans fats. Vegetable oil. Reduced-fat, low-fat, or light mayonnaise and salad dressings (reduced-sodium). Canola, safflower, olive, avocado, soybean, and sunflower oils. Avocado. Seasonings and condiments Herbs. Spices. Seasoning mixes without salt. Other foods Unsalted popcorn and pretzels. Fat-free sweets. The items listed above may not be a complete list of foods and beverages you can eat. Contact a dietitian for more information. What foods should I avoid? Fruits Canned fruit in a light or heavy syrup. Fried fruit. Fruit in cream or butter sauce. Vegetables Creamed or fried vegetables. Vegetables in a cheese sauce. Regular canned vegetables (not low-sodium or reduced-sodium). Regular canned tomato sauce and paste (not low-sodium or reduced-sodium). Regular tomato and vegetable juice (not low-sodium or reduced-sodium). Angie Fava. Olives. Grains Baked goods made with fat, such  as croissants, muffins, or some breads. Dry pasta or rice meal packs. Meats and other proteins Fatty cuts of meat. Ribs. Fried meat. Berniece Salines. Bologna, salami, and other precooked or cured meats, such as sausages or meat loaves. Fat from the back of a pig (fatback). Bratwurst. Salted nuts and seeds. Canned beans with added salt. Canned or smoked fish. Whole eggs or egg yolks. Chicken or [place] with skin. Dairy Whole or 2% milk, cream, and half-and-half. Whole or full-fat cream cheese. Whole-fat or sweetened yogurt. Full-fat cheese. Nondairy creamers. Whipped toppings. Processed cheese and cheese spreads. Fats and oils Butter. Stick margarine. Lard. Shortening. Ghee. Bacon fat. Tropical oils, such as coconut, palm kernel, or palm oil. Seasonings and condiments Onion salt, garlic salt, seasoned salt, table salt, and sea salt. Worcestershire sauce. Tartar sauce. Barbecue sauce. Teriyaki sauce. Soy sauce, including reduced-sodium. Steak sauce. Canned and packaged gravies. Fish sauce. Oyster sauce. Cocktail sauce. Store-bought horseradish. Ketchup. Mustard. Meat flavorings and tenderizers. Bouillon cubes. Hot sauces. Pre-made or packaged marinades. Pre-made or packaged taco seasonings. Relishes. Regular salad dressings. Other foods Salted popcorn and pretzels. The items listed above may not be a complete  list of foods and beverages you should avoid. Contact a dietitian for more information. Where to find more information  National Heart, Lung, and Blood Institute: https://wilson-eaton.com/  American Heart Association: www.heart.org  Academy of Nutrition and Dietetics: www.eatright.Foster: www.kidney.org Summary  The DASH eating plan is a healthy eating plan that has been shown to reduce high blood pressure (hypertension). It may also reduce your risk for type 2 diabetes, heart disease, and stroke.  When on the DASH eating plan, aim to eat more fresh fruits and vegetables, whole  grains, lean proteins, low-fat dairy, and heart-healthy fats.  With the DASH eating plan, you should limit salt (sodium) intake to 2,300 mg a day. If you have hypertension, you may need to reduce your sodium intake to 1,500 mg a day.  Work with your health care provider or dietitian to adjust your eating plan to your individual calorie needs. This information is not intended to replace advice given to you by your health care provider. Make sure you discuss any questions you have with your health care provider. Document Revised: 12/22/2018 Document Reviewed: 12/22/2018 Elsevier Patient Education  2021 [employer].

## 2020-06-03 NOTE — Assessment & Plan Note (Signed)
-   Td 2017 - PNM 23: 2019 - shingrix d/w pt -covid vax x 3, booster rec  CCS: Cscope ( [place]) ~ 2009, s/p colonoscopy in [facility] 07/04/2017,Tubular adenoma, next per GI Female care:  sees gyn, [facility] 09/2019.Pap/HPV 08/03/2017 per [facility] DEXA: 2016,   2021 ([facility]l), next 2026 - Labs: CMP,FLP, A1c, TSH -Diet and exercise discussed.Dash diet recommended

## 2020-06-05 MED ORDER — ATORVASTATIN CALCIUM 10 MG PO TABS: 10.0000 mg | ORAL_TABLET | Freq: Every day | ORAL | 1 refills | Status: DC

## 2020-06-05 MED ORDER — LOSARTAN POTASSIUM 50 MG PO TABS: 50.0000 mg | ORAL_TABLET | Freq: Every day | ORAL | 1 refills | Status: DC

## 2020-06-05 MED ORDER — SYNTHROID 125 MCG PO TABS: 125.0000 ug | ORAL_TABLET | Freq: Every day | ORAL | 1 refills | Status: DC

## 2020-06-05 NOTE — Addendum Note (Signed)
Addended byDamita Dunnings D on: 06/05/2020 11:55 AM   Modules accepted: Orders

## 2020-06-06 ENCOUNTER — Telehealth: Payer: Self-pay

## 2020-06-06 ENCOUNTER — Other Ambulatory Visit: Payer: Self-pay

## 2020-06-06 MED ORDER — LOSARTAN POTASSIUM 50 MG PO TABS: 50.0000 mg | ORAL_TABLET | Freq: Every day | ORAL | 1 refills | Status: DC

## 2020-06-06 NOTE — Telephone Encounter (Signed)
Received call from Pt- [facility] has not received Rx for losartan 50mg  that was sent on 06/05/2020. Informed Pt I'd resend.

## 2020-06-10 ENCOUNTER — Other Ambulatory Visit (HOSPITAL_BASED_OUTPATIENT_CLINIC_OR_DEPARTMENT_OTHER): Payer: Self-pay

## 2020-06-10 MED ORDER — MODERNA COVID-19 VACCINE 100 MCG/0.5ML IM SUSP
INTRAMUSCULAR | 0 refills | Status: DC
  Filled 2020-06-10: qty 0.25, 1d supply, fill #0

## 2020-07-01 ENCOUNTER — Telehealth: Payer: Self-pay

## 2020-07-01 NOTE — Telephone Encounter (Signed)
Pt called- requesting lab results from 06/03/20 to be mailed to her---done.

## 2020-09-18 ENCOUNTER — Other Ambulatory Visit: Payer: Self-pay | Admitting: Internal Medicine

## 2020-10-07 ENCOUNTER — Ambulatory Visit: Payer: BLUE CROSS/BLUE SHIELD | Admitting: Internal Medicine

## 2020-10-20 ENCOUNTER — Encounter: Payer: Self-pay | Admitting: Gastroenterology

## 2020-10-20 IMAGING — MR MR KNEE*L* W/O CM
4 of 6 series · 23 of 40 positions shown · non-contrast
Comparison: None.

CLINICAL DATA: Knee pain since February 2018.

EXAM:
MRI OF THE LEFT KNEE WITHOUT CONTRAST
TECHNIQUE: Multiplanar, multisequence MR imaging of the knee was performed. No
intravenous contrast was administered.

[Series 3: T2 fat-sat · axial · 4.0mm · 0.50mm/px · z∈[-67,+43]mm · 6 of 27 slices shown (1 of 2)]
[im 1/27]
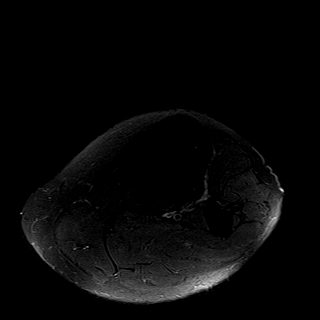
[im 4/27]
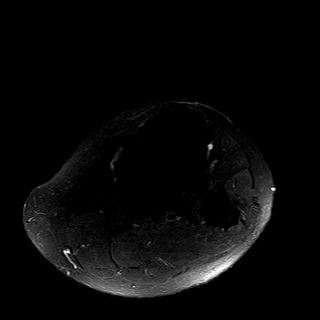
[im 8/27]
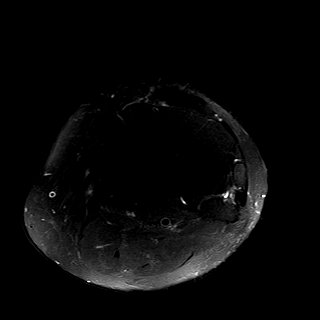
[im 12/27]
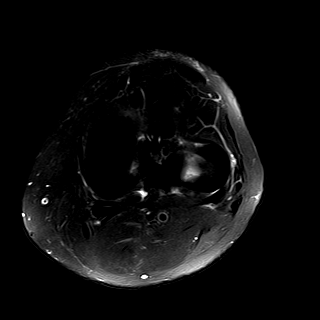
[im 15/27]
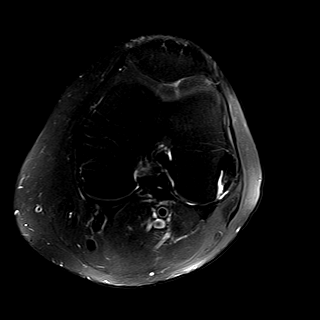
[im 23/27]
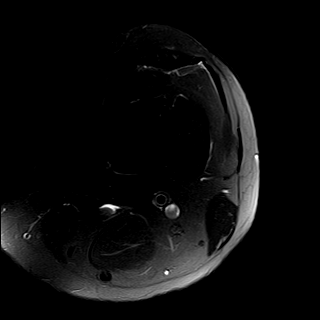

[Series 5: T2 fat-sat · coronal · 4.0mm · 0.29mm/px · 3 of 26 slices shown (2 of 2)]
[im 6/26]
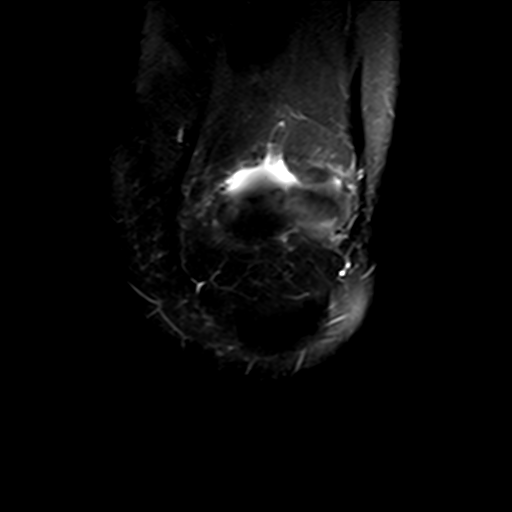
[im 16/26]
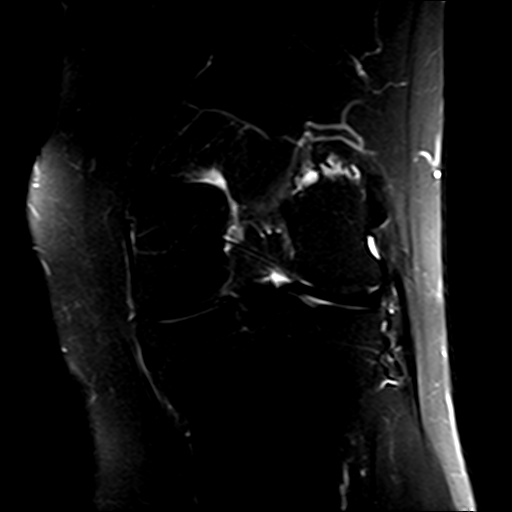
[im 26/26]
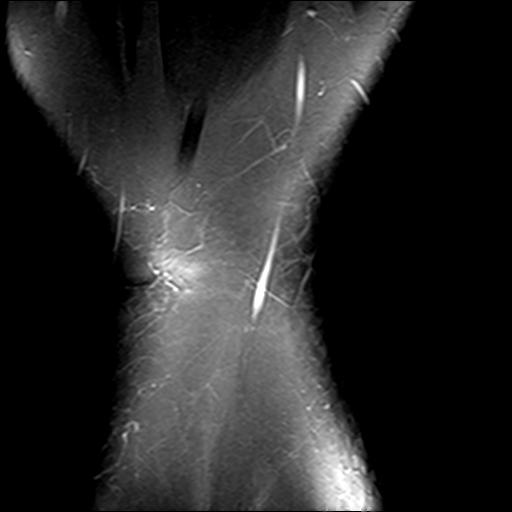

[Series 6: PD fat-sat · coronal · 3.0mm · 0.29mm/px · 8 of 32 slices shown (1 of 2)]
[im 1/32]
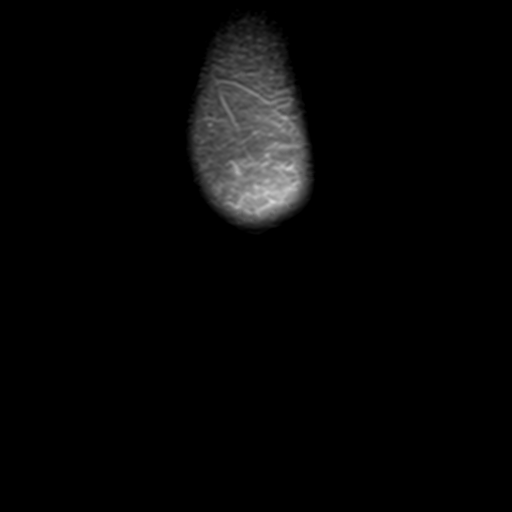
[im 5/32]
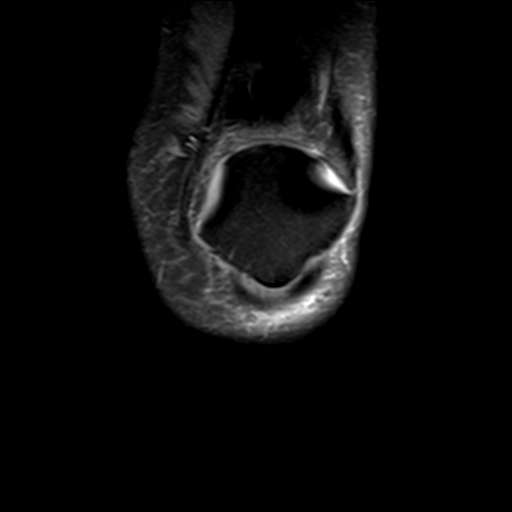
[im 9/32]
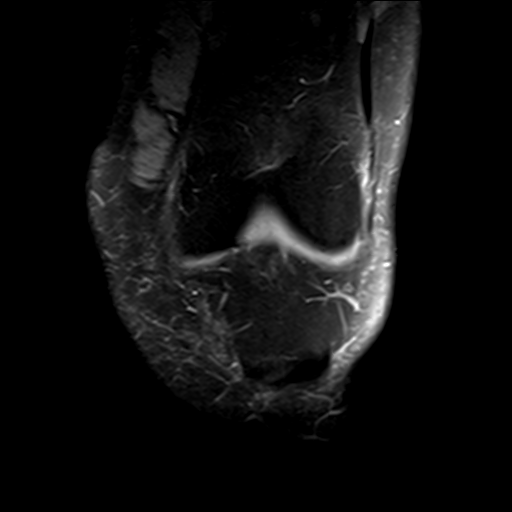
[im 14/32]
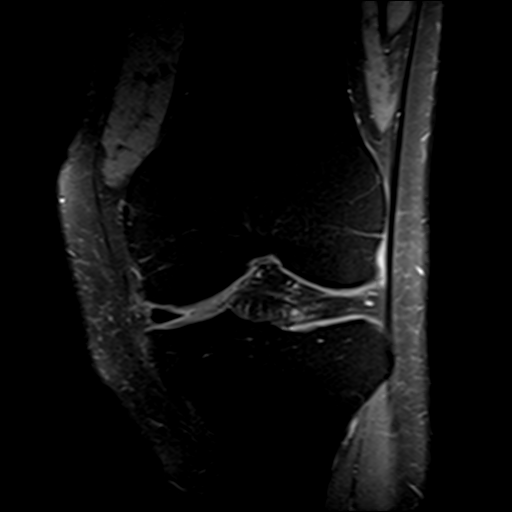
[im 18/32]
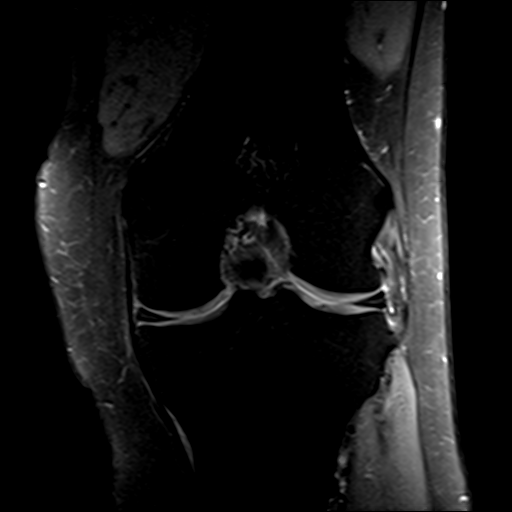
[im 23/32]
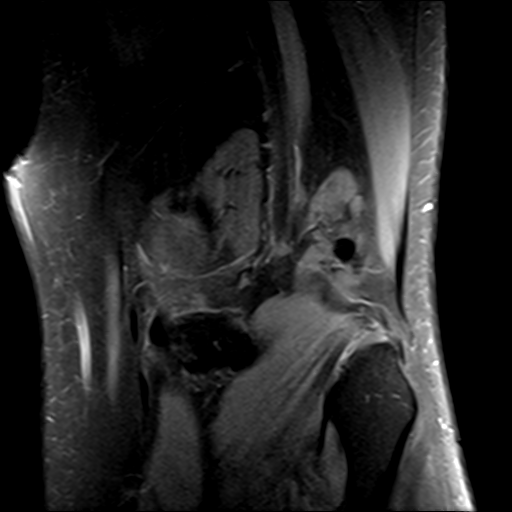
[im 27/32]
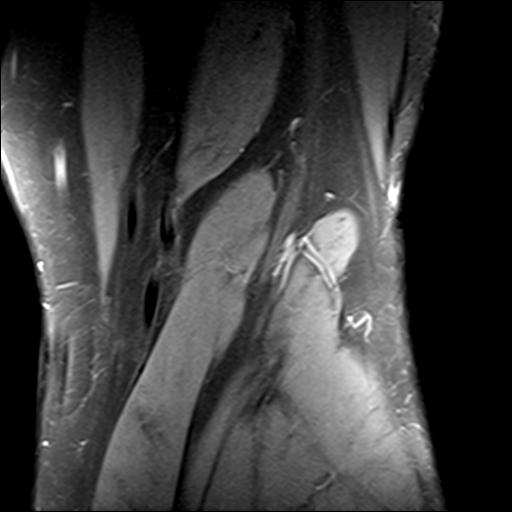
[im 32/32]
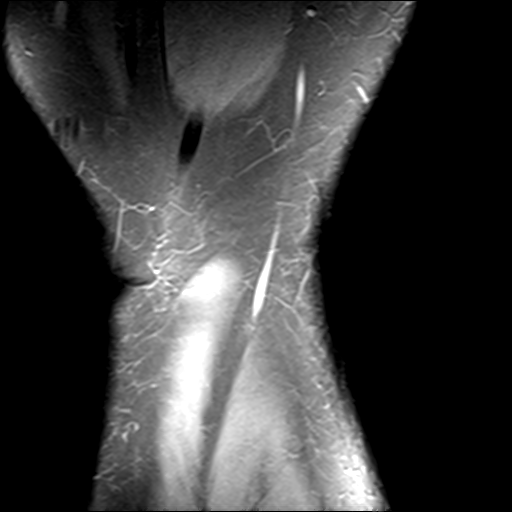

[Series 8: PD fat-sat · sagittal · 3.0mm · 0.29mm/px · 6 of 26 slices shown (2 of 2)]
[im 1/26]
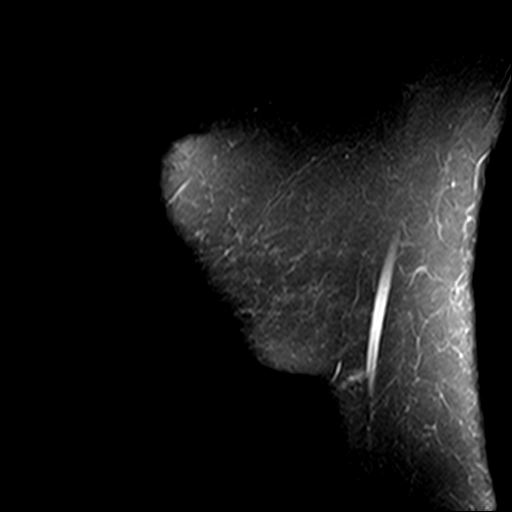
[im 6/26]
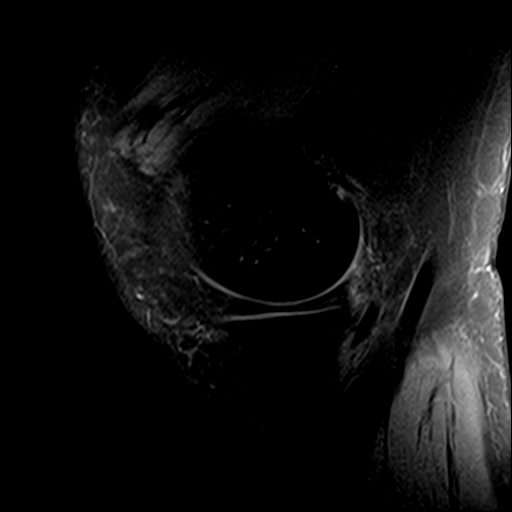
[im 11/26]
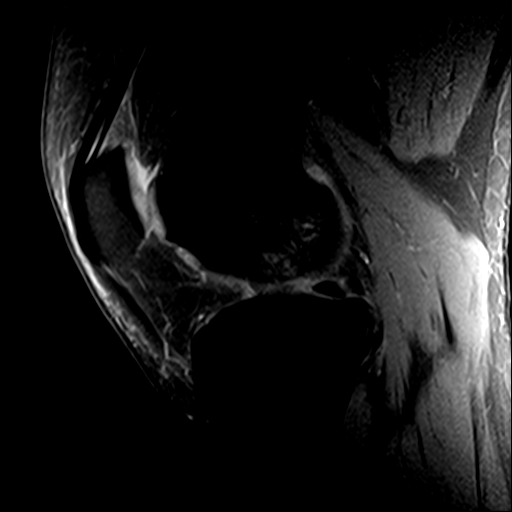
[im 16/26]
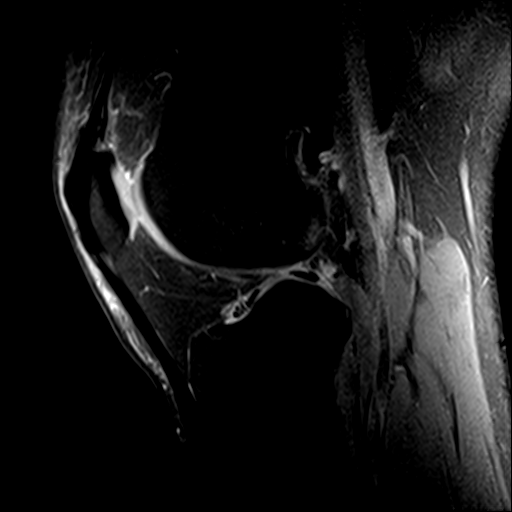
[im 21/26]
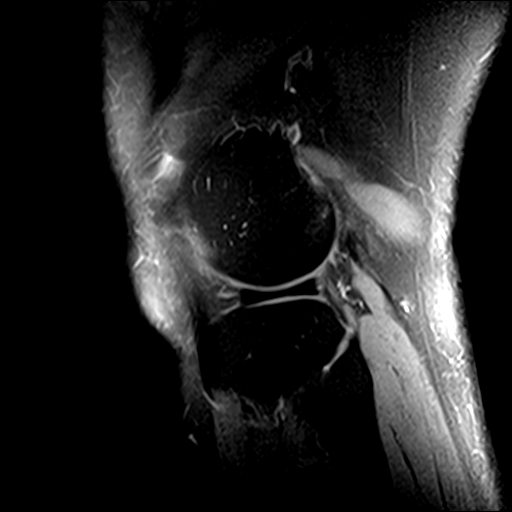
[im 26/26]
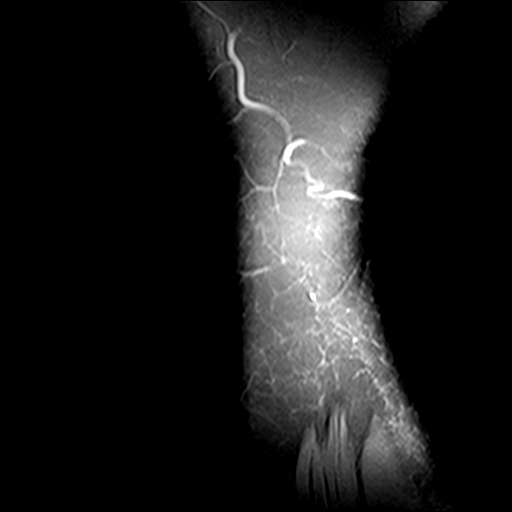

[23 of 40 positions shown; findings below may reference images not displayed]

FINDINGS: MENISCI

Medial meniscus:  Intact

Lateral meniscus:  Intact

LIGAMENTS

Cruciates:  Intact

Collaterals:  Intact

CARTILAGE

Patellofemoral:  Minimal degenerative chondrosis.

Medial:  Mild degenerative chondrosis.

Lateral:  Mild degenerative chondrosis.

Joint: No joint effusion. There is mild thickening and slight
inflammation of the quadriceps fat pad.

Popliteal Fossa:  No popliteal mass or Baker's cyst.

Extensor Mechanism: The patella retinacular structures are intact
and the quadriceps and patellar tendons are intact.

Bones:  No acute bony findings.

Other: Normal knee musculature.
IMPRESSION: 1. Intact ligamentous structures and no acute bony findings.
2. No meniscal tears.
3. Mild tricompartmental degenerative chondrosis but no cartilage
defects or osteochondral lesions.
4. No joint effusion or Baker's cyst.
5. Slight thickening and inflammation of the quadriceps fat pad.

## 2020-11-14 ENCOUNTER — Other Ambulatory Visit: Payer: Self-pay

## 2020-11-14 ENCOUNTER — Encounter: Payer: Self-pay | Admitting: Internal Medicine

## 2020-11-14 ENCOUNTER — Ambulatory Visit (INDEPENDENT_AMBULATORY_CARE_PROVIDER_SITE_OTHER): Payer: Medicare Other | Admitting: Internal Medicine

## 2020-11-14 VITALS — BP 138/82 | HR 69 | Temp 98.3°F | Resp 16 | Ht 65.0 in | Wt 185.2 lb

## 2020-11-14 DIAGNOSIS — I10 Essential (primary) hypertension: Secondary | ICD-10-CM

## 2020-11-14 DIAGNOSIS — M48061 Spinal stenosis, lumbar region without neurogenic claudication: Secondary | ICD-10-CM

## 2020-11-14 DIAGNOSIS — E119 Type 2 diabetes mellitus without complications: Secondary | ICD-10-CM

## 2020-11-14 DIAGNOSIS — Z23 Encounter for immunization: Secondary | ICD-10-CM | POA: Diagnosis not present

## 2020-11-14 DIAGNOSIS — E079 Disorder of thyroid, unspecified: Secondary | ICD-10-CM

## 2020-11-14 DIAGNOSIS — E559 Vitamin D deficiency, unspecified: Secondary | ICD-10-CM

## 2020-11-14 DIAGNOSIS — R5383 Other fatigue: Secondary | ICD-10-CM

## 2020-11-14 LAB — CBC WITH DIFFERENTIAL/PLATELET
Basophils Absolute: 0 10*3/uL (ref 0.0–0.1)
Basophils Relative: 0.6 % (ref 0.0–3.0)
Eosinophils Absolute: 0.1 10*3/uL (ref 0.0–0.7)
Eosinophils Relative: 1.6 % (ref 0.0–5.0)
HCT: 39.4 % (ref 36.0–46.0)
Hemoglobin: 12.5 g/dL (ref 12.0–15.0)
Lymphocytes Relative: 35.3 % (ref 12.0–46.0)
Lymphs Abs: 2.3 10*3/uL (ref 0.7–4.0)
MCHC: 31.6 g/dL (ref 30.0–36.0)
MCV: 81.8 fl (ref 78.0–100.0)
Monocytes Absolute: 0.4 10*3/uL (ref 0.1–1.0)
Monocytes Relative: 6.8 % (ref 3.0–12.0)
Neutro Abs: 3.7 10*3/uL (ref 1.4–7.7)
Neutrophils Relative %: 55.7 % (ref 43.0–77.0)
Platelets: 295 10*3/uL (ref 150.0–400.0)
RBC: 4.81 Mil/uL (ref 3.87–5.11)
RDW: 14.6 % (ref 11.5–15.5)
WBC: 6.6 10*3/uL (ref 4.0–10.5)

## 2020-11-14 LAB — B12 AND FOLATE PANEL
Folate: 12.3 ng/mL (ref >5.9)
Vitamin B-12: 510 pg/mL (ref 211–911)

## 2020-11-14 LAB — VITAMIN D 25 HYDROXY (VIT D DEFICIENCY, FRACTURES): VITD: 30.65 ng/mL (ref 30.00–100.00)

## 2020-11-14 LAB — HEMOGLOBIN A1C: Hgb A1c MFr Bld: 7.1 % — ABNORMAL HIGH (ref 4.6–6.5)

## 2020-11-14 LAB — TSH: TSH: 4.11 u[IU]/mL (ref 0.35–5.50)

## 2020-11-14 NOTE — Patient Instructions (Addendum)
Recommend to proceed with the following vaccine at your pharmacy:  Shingrix (Recommend a booster with a new COVID-vaccine  You are overdue for your repeat colonoscopy with Dr. Lyndel Safe, please call his office at [phone] to get on his schedule.  Stretcher but daily, stay active. We will give you a parking permit for 6 months Tylenol  500 mg OTC 2 tabs a day every 8 hours as needed for pain  GO TO THE LAB : Get the blood work     [place], [place] back for a checkup in 6 months

## 2020-11-14 NOTE — Progress Notes (Addendum)
Subjective:    Patient ID: Kimberly Combs, female    DOB: 1955/12/30, 65 y.o.   MRN: [id number]  DOS:  11/14/2020 Type of visit - description: Follow-up Several issues discussed. Back pain: Pain has resurfaced to some extent, occasional low back pain, occasional radiation to the left leg. Request parking permit. Lack of energy: She feels tired often, would like vitamins and TSH checked. She denies chest pain, difficulty breathing.  No lower extremity edema or palpitations.  No DOE. Denies a snoring or feeling sleepy No depression.  Review of Systems See above   Past Medical History:  Diagnosis Date   Diabetes mellitus ([place])    Hypertension    Thyroid disease     Past Surgical History:  Procedure Laterality Date   CESAREAN SECTION  [id number]   X2   COLONOSCOPY  10 years ago    in [place]="normal exam" per pt    Allergies as of 11/14/2020   No Known Allergies      Medication List        Accurate as of November 14, 2020 11:59 PM. If you have any questions, ask your nurse or doctor.          STOP taking these medications    atorvastatin 10 MG tablet Commonly known as: LIPITOR Stopped by: Kathlene November, MD   Moderna COVID-19 Vaccine 100 MCG/0.5ML injection Generic drug: COVID-19 mRNA vaccine (Moderna) Stopped by: Kathlene November, MD       TAKE these medications    b complex vitamins tablet Take 1 tablet by mouth daily.   losartan 50 MG tablet Commonly known as: COZAAR Take 1 tablet (50 mg total) by mouth daily.   metFORMIN 750 MG 24 hr tablet Commonly known as: GLUCOPHAGE-XR TAKE 2 TABLETS BY MOUTH EVERY MORNING AND 1 TABLET EVERY EVENING   ONE TOUCH LANCETS Misc Check blood sugar three times daily   OneTouch Verio Flex System w/Device Kit Check blood sugar 3 times daily   OneTouch Verio test strip Generic drug: glucose blood CHECK BLOOD SUGAR THREE TIMES DAILY EVERY DAY   Synthroid 125 MCG tablet Generic drug: levothyroxine Take 1 tablet (125 mcg total)  by mouth daily before breakfast.           Objective:   Physical Exam BP 138/82 (BP Location: Left Arm, Patient Position: Sitting, Cuff Size: Small)   Pulse 69   Temp 98.3 F (36.8 C) (Oral)   Resp 16   Ht '5\' 5"'  (1.651 m)   Wt 185 lb 4 oz (84 kg)   SpO2 97%   BMI 30.83 kg/m  General:   Well developed, NAD, BMI noted. HEENT:  Normocephalic . Face symmetric, atraumatic Lungs:  CTA B Normal respiratory effort, no intercostal retractions, no accessory muscle use. Heart: RRR,  no murmur.  DM foot exam: No edema, good pulses, pinprick examination normal Skin: Not pale. Not jaundice Neurologic:  alert & oriented X3.  Speech normal, gait appropriate for age and unassisted. Straight leg test negative DTRs symmetric Psych--  Cognition and judgment appear intact.  Cooperative with normal attention span and concentration.  Behavior appropriate. No anxious or depressed appearing.      Assessment      Assessment (New patient 05/2017, used to see Dr. Delfina Redwood) DM: dx ~ 2014 HTN: dx ~ 2016 Hyperlipidemia: Declined statins (11-2020) Anxiety, insomnia RLS, periodic limb movement per neuro 2019 Hypothyroidism dx remotely  MSK: Back pain, had MRI, DX lumbar stenosis (ortho OV 01-2019)  PLAN DM: Currently  on metformin, check A1c, check a B12. HTN, no change, on losartan, last BMP okay. Hyperlipidemia: Declines statins, aware of benefits. Hypothyroidism: Check TSH Fatigue, lack of energy: ROS benign, no snoring, unclear etiology, request vitamin checks.  We will check vitamin D to rule out deficiency.  Also B12. Low back pain: It has returned to some extent, h/o lumbar stenosis, declined to go back to Ortho, declined formal PT. Currently not taking any medication. Plan: Request a parking permit (will do for 6 months), encouraged to continue self stretching and take Tylenol. Preventive care: Flu shot today Rec Shingrix vaccination  COVID booster x4, last 06/03/2020, new booster  recommended. Due for a colonoscopy per GI letter.  Encouraged to call them. RTC 6 months    This visit occurred during the SARS-CoV-2 public health emergency.  Safety protocols were in place, including screening questions prior to the visit, additional usage of staff PPE, and extensive cleaning of exam room while observing appropriate contact time as indicated for disinfecting solutions.

## 2020-11-15 NOTE — Assessment & Plan Note (Signed)
DM: Currently on metformin, check A1c, check a B12. HTN, no change, on losartan, last BMP okay. Hyperlipidemia: Declines statins, aware of benefits. Hypothyroidism: Check TSH Fatigue, lack of energy: ROS benign, no snoring, unclear etiology, request vitamin checks.  We will check vitamin D to rule out deficiency.  Also B12. Low back pain: It has returned to some extent, h/o lumbar stenosis, declined to go back to Ortho, declined formal PT. Currently not taking any medication. Plan: Request a parking permit (will do for 6 months), encouraged to continue self stretching and take Tylenol. Preventive care: Flu shot today Rec Shingrix vaccination  COVID booster x4, last 06/03/2020, new booster recommended. Due for a colonoscopy per GI letter.  Encouraged to call them. RTC 6 months

## 2020-11-18 MED ORDER — EMPAGLIFLOZIN 10 MG PO TABS: 10.0000 mg | ORAL_TABLET | Freq: Every day | ORAL | 6 refills | Status: DC

## 2020-11-18 NOTE — Addendum Note (Signed)
Addended byDamita Dunnings D on: 11/18/2020 11:28 AM   Modules accepted: Orders

## 2020-12-10 ENCOUNTER — Other Ambulatory Visit: Payer: Self-pay | Admitting: Internal Medicine

## 2020-12-11 ENCOUNTER — Telehealth: Payer: Self-pay | Admitting: Internal Medicine

## 2020-12-11 MED ORDER — RYBELSUS 7 MG PO TABS: 7.0000 mg | ORAL_TABLET | Freq: Every day | ORAL | 3 refills | Status: DC

## 2020-12-11 NOTE — Telephone Encounter (Signed)
Switch to Rybelsus 7 mg 1 tablet daily.  Send a 40-month supply. Advised patient to let us know in few weeks if that is working for her, we could increase the dose gradually. If cost is an issue we could try Actos.

## 2020-12-11 NOTE — Telephone Encounter (Signed)
Spoke w/ Pt- informed of recommendations. Pt verbalized understanding. Rx sent to [facility].

## 2020-12-11 NOTE — Telephone Encounter (Signed)
FYI. Please advise.

## 2020-12-11 NOTE — Telephone Encounter (Signed)
Pt stated medication(Empagliflozin 10 MG) is making her have headaches and feeling nauseous constantly. Pt stated once she stopped medication she started to feel much better and she will not be continuing with the medication.

## 2020-12-30 ENCOUNTER — Other Ambulatory Visit: Payer: Self-pay | Admitting: Internal Medicine

## 2021-03-18 ENCOUNTER — Encounter: Payer: Self-pay | Admitting: Internal Medicine

## 2021-05-15 ENCOUNTER — Ambulatory Visit: Payer: Medicare Other | Admitting: Internal Medicine

## 2021-05-22 DIAGNOSIS — H02831 Dermatochalasis of right upper eyelid: Secondary | ICD-10-CM | POA: Diagnosis not present

## 2021-05-22 DIAGNOSIS — E119 Type 2 diabetes mellitus without complications: Secondary | ICD-10-CM | POA: Diagnosis not present

## 2021-05-22 DIAGNOSIS — H2513 Age-related nuclear cataract, bilateral: Secondary | ICD-10-CM | POA: Diagnosis not present

## 2021-05-22 DIAGNOSIS — Z7984 Long term (current) use of oral hypoglycemic drugs: Secondary | ICD-10-CM | POA: Diagnosis not present

## 2021-05-22 LAB — HM DIABETES EYE EXAM

## 2021-05-27 ENCOUNTER — Encounter: Payer: Self-pay | Admitting: Internal Medicine

## 2021-06-09 ENCOUNTER — Other Ambulatory Visit: Payer: Self-pay | Admitting: Internal Medicine

## 2021-06-09 ENCOUNTER — Encounter: Payer: Self-pay | Admitting: Gastroenterology

## 2021-06-09 DIAGNOSIS — Z1231 Encounter for screening mammogram for malignant neoplasm of breast: Secondary | ICD-10-CM

## 2021-06-19 ENCOUNTER — Ambulatory Visit
Admission: RE | Admit: 2021-06-19 | Discharge: 2021-06-19 | Disposition: A | Discharge status: Home / Self Care | Payer: Medicare Other | Source: Ambulatory Visit | Attending: Internal Medicine | Admitting: Internal Medicine

## 2021-06-19 DIAGNOSIS — Z1231 Encounter for screening mammogram for malignant neoplasm of breast: Secondary | ICD-10-CM

## 2021-06-22 ENCOUNTER — Telehealth: Payer: Self-pay

## 2021-06-22 ENCOUNTER — Ambulatory Visit: Payer: Medicare Other

## 2021-06-22 NOTE — Telephone Encounter (Signed)
Multiple attempt made to reach patient for Pre Visit appt; left message for patient to call back to reschedule PV appt; patient failed to call back to the office to reschedule PV appt prior to EOB day; PV and procedure appts have been cancelled and no show letter will be sent to patient;

## 2021-06-23 ENCOUNTER — Ambulatory Visit (AMBULATORY_SURGERY_CENTER): Payer: Self-pay

## 2021-06-23 ENCOUNTER — Other Ambulatory Visit: Payer: Self-pay

## 2021-06-23 VITALS — Ht 65.0 in | Wt 188.2 lb

## 2021-06-23 DIAGNOSIS — Z86010 Personal history of colonic polyps: Secondary | ICD-10-CM

## 2021-06-23 MED ORDER — PEG 3350-KCL-NA BICARB-NACL 420 G PO SOLR: 4000.0000 mL | Freq: Once | ORAL | 0 refills | Status: AC

## 2021-06-23 NOTE — Progress Notes (Signed)
Denies allergies to eggs or soy products. Denies complication of anesthesia or sedation. Denies use of weight loss medication. Denies use of O2.   Emmi instructions given for colonoscopy.  

## 2021-07-06 ENCOUNTER — Other Ambulatory Visit: Payer: Self-pay | Admitting: Internal Medicine

## 2021-07-06 ENCOUNTER — Telehealth: Payer: Self-pay | Admitting: Internal Medicine

## 2021-07-06 MED ORDER — ONETOUCH VERIO VI STRP: ORAL_STRIP | 12 refills | Status: DC

## 2021-07-06 NOTE — Telephone Encounter (Signed)
Medication: ONETOUCH VERIO test strip   Has the patient contacted their pharmacy? Yes.   (If no, request that the patient contact the pharmacy for the refill.) (If yes, when and what did the pharmacy advise?)  Preferred Pharmacy (with phone number or street name):  [facility] [place], [address]  [place], [place] [address]  Phone:  [phone]  Fax:  [phone]

## 2021-07-06 NOTE — Telephone Encounter (Signed)
Rx sent 

## 2021-07-21 ENCOUNTER — Ambulatory Visit (AMBULATORY_SURGERY_CENTER): Payer: Medicare Other | Admitting: Gastroenterology

## 2021-07-21 ENCOUNTER — Encounter: Payer: Self-pay | Admitting: Gastroenterology

## 2021-07-21 ENCOUNTER — Encounter: Payer: Medicare Other | Admitting: Gastroenterology

## 2021-07-21 VITALS — BP 140/75 | HR 61 | Temp 95.7°F | Resp 13 | Ht 65.0 in | Wt 188.2 lb

## 2021-07-21 DIAGNOSIS — Z86010 Personal history of colonic polyps: Secondary | ICD-10-CM | POA: Diagnosis not present

## 2021-07-21 DIAGNOSIS — Z09 Encounter for follow-up examination after completed treatment for conditions other than malignant neoplasm: Secondary | ICD-10-CM | POA: Diagnosis not present

## 2021-07-21 MED ORDER — SODIUM CHLORIDE 0.9 % IV SOLN: 500.0000 mL | INTRAVENOUS | Status: DC

## 2021-07-21 NOTE — Progress Notes (Signed)
[facility] History and Physical   Primary Care Physician:  Colon Branch, MD   Reason for Procedure:   H/O polyps  Plan:    Colonoscopy     HPI: Kimberly Combs is a 66 y.o. female   No nausea, vomiting, heartburn, regurgitation, odynophagia or dysphagia.  No significant diarrhea or constipation.  No melena or hematochezia. No unintentional weight loss. No abdominal pain.  Past Medical History:  Diagnosis Date   Diabetes mellitus ([place])    Hypertension    Thyroid disease     Past Surgical History:  Procedure Laterality Date   CESAREAN SECTION  [phone]   X2   COLONOSCOPY  10 years ago    in [place]="normal exam" per pt    Prior to Admission medications   Medication Sig Start Date End Date Taking? Authorizing Provider  b complex vitamins tablet Take 1 tablet by mouth daily.   Yes [provider]  losartan (COZAAR) 50 MG tablet Take 1 tablet (50 mg total) by mouth daily. 07/06/21  Yes Paz, Alda Berthold, MD  metFORMIN (GLUCOPHAGE-XR) 750 MG 24 hr tablet TAKE 2 TABLETS BY MOUTH EVERY MORNING AND 1 TABLET EVERY EVENING 09/18/20  Yes Colon Branch, MD  SYNTHROID 125 MCG tablet Take 1 tablet (125 mcg total) by mouth daily before breakfast. 12/30/20  Yes Paz, Alda Berthold, MD  Blood Glucose Monitoring Suppl ([place]) w/Device KIT Check blood sugar 3 times daily 11/30/17   Colon Branch, MD  glucose blood (ONETOUCH VERIO) test strip CHECK BLOOD SUGAR THREE TIMES DAILY EVERY DAY 07/06/21   Colon Branch, MD  ONE [facility] LANCETS MISC Check blood sugar three times daily 11/30/17   Colon Branch, MD  Semaglutide (RYBELSUS) 7 MG TABS Take 7 mg by mouth daily. Patient not taking: Reported on 06/23/2021 12/11/20   Colon Branch, MD    Current Outpatient Medications  Medication Sig Dispense Refill   b complex vitamins tablet Take 1 tablet by mouth daily.     losartan (COZAAR) 50 MG tablet Take 1 tablet (50 mg total) by mouth daily. 90 tablet 1   metFORMIN (GLUCOPHAGE-XR) 750 MG 24  hr tablet TAKE 2 TABLETS BY MOUTH EVERY MORNING AND 1 TABLET EVERY EVENING 270 tablet 1   SYNTHROID 125 MCG tablet Take 1 tablet (125 mcg total) by mouth daily before breakfast. 90 tablet 1   Blood Glucose Monitoring Suppl (ONETOUCH VERIO FLEX SYSTEM) w/Device KIT Check blood sugar 3 times daily 1 kit 0   glucose blood (ONETOUCH VERIO) test strip CHECK BLOOD SUGAR THREE TIMES DAILY EVERY DAY 300 strip 12   ONE TOUCH LANCETS MISC Check blood sugar three times daily 300 each 12   Semaglutide (RYBELSUS) 7 MG TABS Take 7 mg by mouth daily. (Patient not taking: Reported on 06/23/2021) 30 tablet 3   Current Facility-Administered Medications  Medication Dose Route Frequency Provider Last Rate Last Admin   0.9 %  sodium chloride infusion  500 mL Intravenous Continuous Jackquline Denmark, MD        Allergies as of 07/21/2021   (No Known Allergies)    Family History  Problem Relation Age of Onset   Arthritis Mother    Diabetes Father    CAD Neg Hx    Colon cancer Neg Hx    Breast cancer Neg Hx    Esophageal cancer Neg Hx    Stomach cancer Neg Hx    Rectal cancer Neg Hx  Social History   Socioeconomic History   Marital status: Married    Spouse name: Ray   Number of children: 2   Years of education: Not on file   Highest education level: Bachelor's degree (e.g., BA, AB, BS)  Occupational History   Occupation: retired - Education officer, museum , [place]  Tobacco Use   Smoking status: Former    Types: Cigarettes    Quit date: 2000    Years since quitting: 23.4   Smokeless tobacco: Never   Tobacco comments:    light smoker, quit ~ 2000  Vaping Use   Vaping Use: Never used  Substance and Sexual Activity   Alcohol use: No   Drug use: Never   Sexual activity: Not on file  Other Topics Concern   Not on file  Social History Narrative   Born in [place]   Moved from [place] to [place] ~ 2015      Patient is right-handed. She lives with her husband in a 2 story house. She drinks one cup of deca coffee  most days. She and her husband walk occasionally.   Social Determinants of Health   Financial Resource Strain: Not on file  Food Insecurity: Not on file  Transportation Needs: Not on file  Physical Activity: Not on file  Stress: Not on file  Social Connections: Not on file  Intimate Partner Violence: Not on file    Review of Systems: Positive for none All other review of systems negative except as mentioned in the HPI.  Physical Exam: Vital signs in last 24 hours:   General:   Alert,  Well-developed, well-nourished, pleasant and cooperative in NAD Lungs:  Clear throughout to auscultation.   Heart:  Regular rate and rhythm; no murmurs, clicks, rubs,  or gallops. Abdomen:  Soft, nontender and nondistended. Normal bowel sounds.   Neuro/Psych:  Alert and cooperative. Normal mood and affect. A and O x 3    No significant changes were identified.  The patient continues to be an appropriate candidate for the planned procedure and anesthesia.   Carmell Austria, MD. [facility] 07/21/2021 11:34 AM@

## 2021-07-21 NOTE — Op Note (Signed)
[facility] Patient Name: Kimberly Combs Procedure Date: 07/21/2021 11:32 AM MRN: [id number] Endoscopist: Jackquline Denmark , MD Age: 66 Referring MD:  Date of Birth: [birth year] Gender: Female Account #: [id number] Procedure:                Colonoscopy Indications:              High risk colon cancer surveillance: Personal                            history of colonic polyps Medicines:                Monitored Anesthesia Care Procedure:                Pre-Anesthesia Assessment:                           - Prior to the procedure, a History and Physical                            was performed, and patient medications and                            allergies were reviewed. The patient's tolerance of                            previous anesthesia was also reviewed. The risks                            and benefits of the procedure and the sedation                            options and risks were discussed with the patient.                            All questions were answered, and informed consent                            was obtained. Prior Anticoagulants: The patient has                            taken no previous anticoagulant or antiplatelet                            agents. ASA Grade Assessment: II - A patient with                            mild systemic disease. After reviewing the risks                            and benefits, the patient was deemed in                            satisfactory condition to undergo the procedure.  After obtaining informed consent, the colonoscope                            was passed under direct vision. Throughout the                            procedure, the patient's blood pressure, pulse, and                            oxygen saturations were monitored continuously. The                            Olympus PCF-H190DL ([id number]) Colonoscope was                            introduced through the anus and advanced  to the 2                            cm into the ileum. The colonoscopy was performed                            without difficulty. The patient tolerated the                            procedure well. The quality of the bowel                            preparation was good. The terminal ileum, ileocecal                            valve, appendiceal orifice, and rectum were                            photographed. Scope In: 11:41:10 AM Scope Out: 11:53:21 AM Scope Withdrawal Time: 0 hours 9 minutes 6 seconds  Total Procedure Duration: 0 hours 12 minutes 11 seconds  Findings:                 A few small-mouthed diverticula were found in the                            sigmoid colon.                           Non-bleeding internal hemorrhoids were found during                            retroflexion. The hemorrhoids were small and Grade                            I (internal hemorrhoids that do not prolapse).                           The terminal ileum appeared normal.  The exam was otherwise without abnormality on                            direct and retroflexion views. Complications:            No immediate complications. Estimated Blood Loss:     Estimated blood loss: none. Impression:               - Minimal sigmoid diverticulosis.                           - Non-bleeding internal hemorrhoids.                           - The examined portion of the ileum was normal.                           - The examination was otherwise normal on direct                            and retroflexion views.                           - No specimens collected. Recommendation:           - Patient has a contact number available for                            emergencies. The signs and symptoms of potential                            delayed complications were discussed with the                            patient. Return to normal activities tomorrow.                             Written discharge instructions were provided to the                            patient.                           - Resume previous diet.                           - Continue present medications.                           - Repeat colonoscopy in 10 years for screening                            purposes. Earlier, if with any new problems or                            change in family history.                           -  The findings and recommendations were discussed                            with the patient's family. Jackquline Denmark, MD 07/21/2021 11:57:40 AM This report has been signed electronically.

## 2021-07-21 NOTE — Patient Instructions (Signed)
Handout on hemorrhoids and diverticulosis given.  YOU HAD AN ENDOSCOPIC PROCEDURE TODAY AT [facility]:   Refer to the procedure report that was given to you for any specific questions about what was found during the examination.  If the procedure report does not answer your questions, please call your gastroenterologist to clarify.  If you requested that your care partner not be given the details of your procedure findings, then the procedure report has been included in a sealed envelope for you to review at your convenience later.  YOU SHOULD EXPECT: Some feelings of bloating in the abdomen. Passage of more gas than usual.  Walking can help get rid of the air that was put into your GI tract during the procedure and reduce the bloating. If you had a lower endoscopy (such as a colonoscopy or flexible sigmoidoscopy) you may notice spotting of blood in your stool or on the toilet paper. If you underwent a bowel prep for your procedure, you may not have a normal bowel movement for a few days.  Please Note:  You might notice some irritation and congestion in your nose or some drainage.  This is from the oxygen used during your procedure.  There is no need for concern and it should clear up in a day or so.  SYMPTOMS TO REPORT IMMEDIATELY:  Following lower endoscopy (colonoscopy or flexible sigmoidoscopy):  Excessive amounts of blood in the stool  Significant tenderness or worsening of abdominal pains  Swelling of the abdomen that is new, acute  Fever of 100F or higher  For urgent or emergent issues, a gastroenterologist can be reached at any hour by calling [phone]. Do not use MyChart messaging for urgent concerns.    DIET:  We do recommend a small meal at first, but then you may proceed to your regular diet.  Drink plenty of fluids but you should avoid alcoholic beverages for 24 hours.  ACTIVITY:  You should plan to take it easy for the rest of today and you should NOT  DRIVE or use heavy machinery until tomorrow (because of the sedation medicines used during the test).    FOLLOW UP: Our staff will call the number listed on your records 24-72 hours following your procedure to check on you and address any questions or concerns that you may have regarding the information given to you following your procedure. If we do not reach you, we will leave a message.  We will attempt to reach you two times.  During this call, we will ask if you have developed any symptoms of COVID 19. If you develop any symptoms (ie: fever, flu-like symptoms, shortness of breath, cough etc.) before then, please call [phone].  If you test positive for Covid 19 in the 2 weeks post procedure, please call and report this information to [place].    If any biopsies were taken you will be contacted by phone or by letter within the next 1-3 weeks.  Please call us at [phone] if you have not heard about the biopsies in 3 weeks.    SIGNATURES/CONFIDENTIALITY: You and/or your care partner have signed paperwork which will be entered into your electronic medical record.  These signatures attest to the fact that that the information above on your After Visit Summary has been reviewed and is understood.  Full responsibility of the confidentiality of this discharge information lies with you and/or your care-partner.

## 2021-07-21 NOTE — Progress Notes (Signed)
To pacu, VSS. Report to [email or web address] 

## 2021-07-22 ENCOUNTER — Telehealth: Payer: Self-pay

## 2021-07-22 NOTE — Telephone Encounter (Signed)
  Follow up Call-     07/21/2021   11:07 AM  Call back number  Post procedure Call Back phone  # [phone]  Permission to leave phone message Yes     Patient questions:  Do you have a fever, pain , or abdominal swelling? No. Pain Score  0 *  Have you tolerated food without any problems? Yes.    Have you been able to return to your normal activities? Yes.    Do you have any questions about your discharge instructions: Diet   No. Medications  No. Follow up visit  No.  Do you have questions or concerns about your Care? No.  Actions: * If pain score is 4 or above: No action needed, pain <4.

## 2021-07-30 ENCOUNTER — Other Ambulatory Visit: Payer: Self-pay | Admitting: Nurse Practitioner

## 2021-07-30 ENCOUNTER — Other Ambulatory Visit (HOSPITAL_COMMUNITY)
Admission: RE | Admit: 2021-07-30 | Discharge: 2021-07-30 | Disposition: A | Discharge status: Home / Self Care | Payer: Medicare Other | Source: Ambulatory Visit | Attending: Nurse Practitioner | Admitting: Nurse Practitioner

## 2021-07-30 DIAGNOSIS — Z01419 Encounter for gynecological examination (general) (routine) without abnormal findings: Secondary | ICD-10-CM | POA: Diagnosis not present

## 2021-07-30 DIAGNOSIS — Z124 Encounter for screening for malignant neoplasm of cervix: Secondary | ICD-10-CM | POA: Insufficient documentation

## 2021-07-30 DIAGNOSIS — Z1151 Encounter for screening for human papillomavirus (HPV): Secondary | ICD-10-CM | POA: Insufficient documentation

## 2021-08-05 LAB — CYTOLOGY - PAP
Comment: NEGATIVE
Diagnosis: NEGATIVE
High risk HPV: NEGATIVE

## 2021-08-10 ENCOUNTER — Ambulatory Visit: Payer: Medicare Other | Admitting: Internal Medicine

## 2021-08-12 ENCOUNTER — Ambulatory Visit (INDEPENDENT_AMBULATORY_CARE_PROVIDER_SITE_OTHER): Payer: Medicare Other | Admitting: Internal Medicine

## 2021-08-12 ENCOUNTER — Encounter: Payer: Self-pay | Admitting: Internal Medicine

## 2021-08-12 VITALS — BP 138/84 | HR 74 | Temp 98.6°F | Resp 18 | Ht 65.0 in | Wt 183.2 lb

## 2021-08-12 DIAGNOSIS — E119 Type 2 diabetes mellitus without complications: Secondary | ICD-10-CM

## 2021-08-12 DIAGNOSIS — E079 Disorder of thyroid, unspecified: Secondary | ICD-10-CM

## 2021-08-12 DIAGNOSIS — E785 Hyperlipidemia, unspecified: Secondary | ICD-10-CM | POA: Diagnosis not present

## 2021-08-12 DIAGNOSIS — I10 Essential (primary) hypertension: Secondary | ICD-10-CM

## 2021-08-12 LAB — COMPREHENSIVE METABOLIC PANEL
ALT: 34 U/L (ref 0–35)
AST: 25 U/L (ref 0–37)
Albumin: 4.7 g/dL (ref 3.5–5.2)
Alkaline Phosphatase: 79 U/L (ref 39–117)
BUN: 12 mg/dL (ref 6–23)
CO2: 30 mEq/L (ref 19–32)
Calcium: 10.2 mg/dL (ref 8.4–10.5)
Chloride: 100 mEq/L (ref 96–112)
Creatinine, Ser: 0.73 mg/dL (ref 0.40–1.20)
GFR: 85.93 mL/min (ref >60.00)
Glucose, Bld: 117 mg/dL — ABNORMAL HIGH (ref 70–99)
Potassium: 4 mEq/L (ref 3.5–5.1)
Sodium: 139 mEq/L (ref 135–145)
Total Bilirubin: 0.7 mg/dL (ref 0.2–1.2)
Total Protein: 7.9 g/dL (ref 6.0–8.3)

## 2021-08-12 LAB — CBC WITH DIFFERENTIAL/PLATELET
Basophils Absolute: 0 10*3/uL (ref 0.0–0.1)
Basophils Relative: 0.7 % (ref 0.0–3.0)
Eosinophils Absolute: 0.1 10*3/uL (ref 0.0–0.7)
Eosinophils Relative: 1.5 % (ref 0.0–5.0)
HCT: 41.1 % (ref 36.0–46.0)
Hemoglobin: 13.1 g/dL (ref 12.0–15.0)
Lymphocytes Relative: 35.2 % (ref 12.0–46.0)
Lymphs Abs: 2.5 10*3/uL (ref 0.7–4.0)
MCHC: 31.8 g/dL (ref 30.0–36.0)
MCV: 80.8 fl (ref 78.0–100.0)
Monocytes Absolute: 0.5 10*3/uL (ref 0.1–1.0)
Monocytes Relative: 6.8 % (ref 3.0–12.0)
Neutro Abs: 3.9 10*3/uL (ref 1.4–7.7)
Neutrophils Relative %: 55.8 % (ref 43.0–77.0)
Platelets: 291 10*3/uL (ref 150.0–400.0)
RBC: 5.09 Mil/uL (ref 3.87–5.11)
RDW: 15.1 % (ref 11.5–15.5)
WBC: 7 10*3/uL (ref 4.0–10.5)

## 2021-08-12 LAB — LIPID PANEL
Cholesterol: 206 mg/dL — ABNORMAL HIGH (ref 0–200)
HDL: 71.7 mg/dL (ref >39.00)
LDL Cholesterol: 107 mg/dL — ABNORMAL HIGH (ref 0–99)
NonHDL: 134.43
Total CHOL/HDL Ratio: 3
Triglycerides: 136 mg/dL (ref 0.0–149.0)
VLDL: 27.2 mg/dL (ref 0.0–40.0)

## 2021-08-12 LAB — HEMOGLOBIN A1C: Hgb A1c MFr Bld: 7.2 % — ABNORMAL HIGH (ref 4.6–6.5)

## 2021-08-12 LAB — TSH: TSH: 2.33 u[IU]/mL (ref 0.35–5.50)

## 2021-08-12 MED ORDER — SHINGRIX 50 MCG/0.5ML IM SUSR: 0.5000 mL | Freq: Once | INTRAMUSCULAR | 1 refills | Status: AC

## 2021-08-12 NOTE — Progress Notes (Signed)
Subjective:    Patient ID: Kimberly Combs, female    DOB: 1955/09/13, 66 y.o.   MRN: [id number]  DOS:  08/12/2021 Type of visit - description: f/u  Since her last office visit she is doing well. We review her chronic medical issues. Preventive care discussed as well.   Review of Systems Denies chest pain or difficulty breathing No lower extremity edema No nausea vomiting. No dysuria, gross hematuria or difficulty urinating  Past Medical History:  Diagnosis Date   Diabetes mellitus ([place])    Hypertension    Thyroid disease     Past Surgical History:  Procedure Laterality Date   CESAREAN SECTION  1657,9038   X2   COLONOSCOPY  10 years ago    in [place]="normal exam" per pt   Social History   Socioeconomic History   Marital status: Married    Spouse name: Ray   Number of children: 2   Years of education: Not on file   Highest education level: Bachelor's degree (e.g., BA, AB, BS)  Occupational History   Occupation: retired - Education officer, museum , [place]  Tobacco Use   Smoking status: Former    Types: Cigarettes    Quit date: 2000    Years since quitting: 23.5   Smokeless tobacco: Never   Tobacco comments:    light smoker, quit ~ 2000  Vaping Use   Vaping Use: Never used  Substance and Sexual Activity   Alcohol use: No   Drug use: Never   Sexual activity: Not on file  Other Topics Concern   Not on file  Social History Narrative   Born in [place]   Moved from [place] to [place] ~ 2015      Patient is right-handed. She lives with her husband in a 2 story house. She drinks one cup of deca coffee most days. She and her husband walk occasionally.   Social Determinants of Health   Financial Resource Strain: Not on file  Food Insecurity: Not on file  Transportation Needs: Not on file  Physical Activity: Not on file  Stress: Not on file  Social Connections: Not on file  Intimate Partner Violence: Not on file     Current Outpatient Medications  Medication Instructions    aspirin EC 81 mg, Oral, Daily, Swallow whole.   b complex vitamins tablet 1 tablet, Oral, Daily   Blood Glucose Monitoring Suppl (ONETOUCH VERIO FLEX SYSTEM) w/Device KIT Check blood sugar 3 times daily   glucose blood (ONETOUCH VERIO) test strip CHECK BLOOD SUGAR THREE TIMES DAILY EVERY DAY   losartan (COZAAR) 50 mg, Oral, Daily   metFORMIN (GLUCOPHAGE-XR) 750 MG 24 hr tablet TAKE 2 TABLETS BY MOUTH EVERY MORNING AND 1 TABLET EVERY EVENING   ONE TOUCH LANCETS MISC Check blood sugar three times daily   Rybelsus 7 mg, Oral, Daily   Synthroid 125 mcg, Oral, Daily before breakfast   Zoster Vaccine Adjuvanted (SHINGRIX) injection 0.5 mLs, Intramuscular,  Once       Objective:   Physical Exam BP 138/84   Pulse 74   Temp 98.6 F (37 C) (Oral)   Resp 18   Ht '5\' 5"'  (1.651 m)   Wt 183 lb 4 oz (83.1 kg)   SpO2 98%   BMI 30.49 kg/m  General: Well developed, NAD, BMI noted Neck: No  thyromegaly  HEENT:  Normocephalic . Face symmetric, atraumatic Lungs:  CTA B Normal respiratory effort, no intercostal retractions, no accessory muscle use. Heart: RRR,  no murmur.  Abdomen:  Not distended, soft, non-tender. No rebound or rigidity.   DM foot exam: No edema, good pedal pulses, pinprick examination Skin: Exposed areas without rash. Not pale. Not jaundice Neurologic:  alert & oriented X3.  Speech normal, gait appropriate for age and unassisted Strength symmetric and appropriate for age.  Psych: Cognition and judgment appear intact.  Cooperative with normal attention span and concentration.  Behavior appropriate. No anxious or depressed appearing.     Assessment     Assessment (New patient 05/2017, used to see Dr. Delfina Redwood) DM: dx ~ 2014 HTN: dx ~ 2016 Hyperlipidemia: Declined statins (11-2020) Anxiety, insomnia RLS, periodic limb movement per neuro 2019 Hypothyroidism dx remotely  MSK: Back pain, had MRI, DX lumbar stenosis (ortho OV 01-2019)  PLAN DM: On metformin, Rybelsus,  doing great with diet and exercise.  Ambulatory CBG range from 97-160 depending on diet.  DM foot exam negative, praised for her healthy lifestyle. Check A1c.    HTN: On losartan, ambulatory BPs reportedly normal.  Check a CMP and CBC Hyperlipidemia: Although LDL levels are "ok" I advised about the benefits of statins on patients with DM, she declines but will let me know change her mind.  Check FLP Hypothyroidism: On Synthroid, checking labs CV RF: In addition to statin I discussed the benefits of aspirin 81 mg daily, states she will start. Preventive care reviewed. RTC 6 months

## 2021-08-12 NOTE — Assessment & Plan Note (Signed)
DM: On metformin, Rybelsus, doing great with diet and exercise.  Ambulatory CBG range from 97-160 depending on diet.  DM foot exam negative, praised for her healthy lifestyle. Check A1c.    HTN: On losartan, ambulatory BPs reportedly normal.  Check a CMP and CBC Hyperlipidemia: Although LDL levels are "ok" I advised about the benefits of statins on patients with DM, she declines but will let me know change her mind.  Check FLP Hypothyroidism: On Synthroid, checking labs CV RF: In addition to statin I discussed the benefits of aspirin 81 mg daily, states she will start. Preventive care reviewed. RTC 6 months

## 2021-08-12 NOTE — Assessment & Plan Note (Signed)
-   Td 2017 - PNM 23: 2019.  PNM 20: Declined   - shingrix: Printed a prescription - covid vax booster recommended - Flu shot every fall *She is not inclined to pursue vaccinations but will think about it CCS: Cscope ( [place]) ~ 2009, s/p colonoscopy in [facility] 07/04/2017,Tubular adenoma, C-scope 07/21/2021, next per GI Female care:  sees gyn, [facility] 06-2021. PAP 08-05-2021  per [facility] DEXA: 2016,   2021 (wnl), next 2026 ACP info provided

## 2021-08-12 NOTE — Patient Instructions (Addendum)
Recommend to proceed with the following vaccines at your pharmacy:  Shingrix (shingles)- see printed prescription Covid booster (bivalent) Flu shot this fall  Start taking aspirin 81 mg: 1 tablet daily   Check the  blood pressure regularly BP GOAL is between 110/65 and  135/85. If it is consistently higher or lower, let me know  Diabetes: Check your blood sugar at different times  - early in AM fasting  ( goal 70-130) - 2 hours after a meal (goal less than 180)     GO TO THE LAB : Get the blood work     [place], [place] back for a checkup in 6 months

## 2021-08-14 MED ORDER — ACARBOSE 25 MG PO TABS: 25.0000 mg | ORAL_TABLET | Freq: Three times a day (TID) | ORAL | 6 refills | Status: DC

## 2021-08-14 NOTE — Addendum Note (Signed)
Addended byDamita Dunnings D on: 08/14/2021 12:59 PM   Modules accepted: Orders

## 2021-08-28 ENCOUNTER — Telehealth: Payer: Self-pay | Admitting: Internal Medicine

## 2021-08-28 NOTE — Telephone Encounter (Signed)
Please advise 

## 2021-08-28 NOTE — Telephone Encounter (Signed)
Patient states that the acarbose (PRECOSE) 25 MG tablet medication given to her on her last OV is giving her diarrhea and not working. She would like to know what Dr. Larose Kells recommends. Please advise.

## 2021-08-31 MED ORDER — RYBELSUS 3 MG PO TABS: 3.0000 mg | ORAL_TABLET | Freq: Every day | ORAL | 0 refills | Status: DC

## 2021-08-31 NOTE — Telephone Encounter (Signed)
Spoke w/ Pt- informed her of recommendations. I did ask if she was taking the Rybelsus '7mg'$ , she informed me that the only diabetic medication she is currently taking is metformin xr '750mg'$ . I informed Pt to let me discuss w/ PCP and I would call back. Pt verbalized understanding.

## 2021-08-31 NOTE — Telephone Encounter (Signed)
Jardiance because headaches and feeling nauseous. Now a carbose causing diarrhea. Will try a different SGLT2: - Stop acarbose. - Start Farxiga 5 mg 1 tablet daily, send a prescription. - Other medications the same. - Watch for vaginal irritation.  If that is the case to stop and let me know - Arrange office visit in 3 months

## 2021-08-31 NOTE — Telephone Encounter (Signed)
Spoke w/ Pt- informed of recommendations. Rybelsus '3mg'$  sent to [facility]. Made a note for myself to send Rybelsus '7mg'$  in 3 weeks. Pt will call back to schedule 3 month f/u.

## 2021-08-31 NOTE — Telephone Encounter (Signed)
Okay. Rybelsus 3 mg daily for 1 month, then Rybelsus 7 mg daily thereafter. Send prescriptions OV 61-month

## 2021-09-08 ENCOUNTER — Other Ambulatory Visit: Payer: Self-pay | Admitting: Internal Medicine

## 2021-09-21 ENCOUNTER — Other Ambulatory Visit: Payer: Self-pay

## 2021-09-21 MED ORDER — RYBELSUS 7 MG PO TABS
7.0000 mg | ORAL_TABLET | Freq: Every day | ORAL | 1 refills | Status: DC
Start: 2021-09-21 — End: 2021-11-26

## 2021-11-26 ENCOUNTER — Ambulatory Visit (INDEPENDENT_AMBULATORY_CARE_PROVIDER_SITE_OTHER): Payer: Medicare Other | Admitting: Internal Medicine

## 2021-11-26 ENCOUNTER — Encounter: Payer: Self-pay | Admitting: Internal Medicine

## 2021-11-26 VITALS — BP 134/78 | HR 68 | Temp 98.2°F | Resp 16 | Ht 65.0 in | Wt 180.2 lb

## 2021-11-26 DIAGNOSIS — Z23 Encounter for immunization: Secondary | ICD-10-CM

## 2021-11-26 DIAGNOSIS — E079 Disorder of thyroid, unspecified: Secondary | ICD-10-CM

## 2021-11-26 DIAGNOSIS — M48061 Spinal stenosis, lumbar region without neurogenic claudication: Secondary | ICD-10-CM | POA: Diagnosis not present

## 2021-11-26 DIAGNOSIS — E119 Type 2 diabetes mellitus without complications: Secondary | ICD-10-CM

## 2021-11-26 LAB — TSH: TSH: 0.51 u[IU]/mL (ref 0.35–5.50)

## 2021-11-26 LAB — HEMOGLOBIN A1C: Hgb A1c MFr Bld: 7 % — ABNORMAL HIGH (ref 4.6–6.5)

## 2021-11-26 MED ORDER — CYCLOBENZAPRINE HCL 10 MG PO TABS: 10.0000 mg | ORAL_TABLET | Freq: Three times a day (TID) | ORAL | 0 refills | Status: DC | PRN

## 2021-11-26 NOTE — Patient Instructions (Addendum)
Continue Tylenol for back pain Reach out to your orthopedic doctor You can take a muscle relaxant called Flexeril, up to 3 times a day.  May cause drowsiness.  Be careful  Diabetes: Check your blood sugar at different times  - early in AM fasting  ( goal 70-130) - 2 hours after a meal (goal less than 180)     GO TO THE LAB : Get the blood work     [place], [place] back for a checkup in 3 months

## 2021-11-26 NOTE — Assessment & Plan Note (Signed)
DM: Currently on metformin only, last A1c was elevated, was Rx acarbose but developed diarrhea. Since then is doing great with diet.  CBGs in the morning around 100, postprandial around 120.  She hopes A1c will be better. If A1c elevated, options are limited: Rybelsus: $$ Jardiance: HA, nausea Acarbose: ++ diarrrhea  Thus consider Actos or glipizide. High cholesterol: Declines statins Hypothyroidism:  Check TSH Radiculopathy: See HPI, recommend to continue Tylenol, Rx Flexeril, watch for somnolence plans to reach out to her orthopedic doctor. Preventive care: Flu shot today, advised about the benefits of a COVID-vaccine. RTC 3 months

## 2021-11-26 NOTE — Progress Notes (Addendum)
Subjective:    Patient ID: Kimberly Combs, female    DOB: May 08, 1955, 66 y.o.   MRN: [id number]  DOS:  11/26/2021 Type of visit - description: Follow-up  Here for management of diabetes. Having trouble  tolerating medications. She decided to do much better with diet.  Having good ambulatory CBGs  Hypothyroidism: Requested TSH  About 2 weeks ago developed left back pain with radiation to the left leg. History of previous similar symptoms. No injury, no paresthesias, no fever or rash. No bladder or bowel incontinence. On Tylenol only.   Review of Systems See above   Past Medical History:  Diagnosis Date   Diabetes mellitus ([place])    Hypertension    Thyroid disease     Past Surgical History:  Procedure Laterality Date   CESAREAN SECTION  3329,5188   X2   COLONOSCOPY  10 years ago    in [place]="normal exam" per pt    Current Outpatient Medications  Medication Instructions   aspirin EC 81 mg, Oral, Daily, Swallow whole.   b complex vitamins tablet 1 tablet, Oral, Daily   Blood Glucose Monitoring Suppl (ONETOUCH VERIO FLEX SYSTEM) w/Device KIT Check blood sugar 3 times daily   cyclobenzaprine (FLEXERIL) 10 mg, Oral, 3 times daily PRN   glucose blood (ONETOUCH VERIO) test strip CHECK BLOOD SUGAR THREE TIMES DAILY EVERY DAY   losartan (COZAAR) 50 mg, Oral, Daily   metFORMIN (GLUCOPHAGE-XR) 750 MG 24 hr tablet TAKE 2 TABLETS BY MOUTH EVERY MORNING AND 1 TABLET EVERY EVENING   ONE TOUCH LANCETS MISC Check blood sugar three times daily   Synthroid 125 mcg, Oral, Daily before breakfast       Objective:   Physical Exam BP 134/78   Pulse 68   Temp 98.2 F (36.8 C) (Oral)   Resp 16   Ht '5\' 5"'  (1.651 m)   Wt 180 lb 4 oz (81.8 kg)   SpO2 96%   BMI 30.00 kg/m  General:   Well developed, NAD, BMI noted. HEENT:  Normocephalic . Face symmetric, atraumatic Lungs:  CTA B Normal respiratory effort, no intercostal retractions, no accessory muscle use. Heart: RRR,  no murmur.   Lower extremities: no pretibial edema bilaterally  Skin: Not pale. Not jaundice Neurologic:  alert & oriented X3.  Speech normal, gait and transferring somewhat antalgic Motor symmetric, DTR symmetric Straight leg test question of + on the left. Psych--  Cognition and judgment appear intact.  Cooperative with normal attention span and concentration.  Behavior appropriate. No anxious or depressed appearing.      Assessment     Assessment (New patient 05/2017, used to see Dr. Delfina Redwood) DM: dx ~ 2014 Rybelsus: $$ Jardiance: HA, nausea Acarbose: ++ diarrrhea  HTN: dx ~ 2016 Hyperlipidemia: Declined statins (11-2020) Hypothyroidism Anxiety, insomnia RLS, periodic limb movement per neuro 2019 MSK: Back pain, had MRI, DX lumbar stenosis (ortho OV 01-2019)  PLAN DM: Currently on metformin only, last A1c was elevated, was Rx acarbose but developed diarrhea. Since then is doing great with diet.  CBGs in the morning around 100, postprandial around 120.  She hopes A1c will be better. If A1c elevated, options are limited: Rybelsus: $$ Jardiance: HA, nausea Acarbose: ++ diarrrhea  Thus consider Actos or glipizide. High cholesterol: Declines statins Hypothyroidism:  Check TSH Radiculopathy: See HPI, recommend to continue Tylenol, Rx Flexeril, watch for somnolence plans to reach out to her orthopedic doctor. Preventive care: Flu shot today, advised about the benefits of a COVID-vaccine. RTC 3  months

## 2021-12-03 ENCOUNTER — Encounter: Payer: Self-pay | Admitting: Internal Medicine

## 2021-12-14 ENCOUNTER — Ambulatory Visit: Payer: Self-pay

## 2021-12-14 ENCOUNTER — Ambulatory Visit (INDEPENDENT_AMBULATORY_CARE_PROVIDER_SITE_OTHER): Payer: Medicare Other | Admitting: Surgery

## 2021-12-14 ENCOUNTER — Encounter: Payer: Self-pay | Admitting: Surgery

## 2021-12-14 DIAGNOSIS — G8929 Other chronic pain: Secondary | ICD-10-CM | POA: Diagnosis not present

## 2021-12-14 DIAGNOSIS — M25512 Pain in left shoulder: Secondary | ICD-10-CM | POA: Diagnosis not present

## 2021-12-14 DIAGNOSIS — M7542 Impingement syndrome of left shoulder: Secondary | ICD-10-CM | POA: Diagnosis not present

## 2021-12-14 DIAGNOSIS — M48061 Spinal stenosis, lumbar region without neurogenic claudication: Secondary | ICD-10-CM | POA: Diagnosis not present

## 2021-12-14 DIAGNOSIS — M4726 Other spondylosis with radiculopathy, lumbar region: Secondary | ICD-10-CM

## 2021-12-14 DIAGNOSIS — M545 Low back pain, unspecified: Secondary | ICD-10-CM | POA: Diagnosis not present

## 2021-12-14 NOTE — Progress Notes (Signed)
Office Visit Note   Patient: Kimberly Combs           Date of Birth: 1955-02-25           MRN: [id number] Visit Date: 12/14/2021              Requested by: Colon Branch, [address] [place],  [place] [address] PCP: Colon Branch, MD   Assessment & Plan: Visit Diagnoses:  1. Chronic low back pain, unspecified back pain laterality, unspecified whether sciatica present   2. Chronic left shoulder pain   3. Impingement syndrome of left shoulder   4. Spinal stenosis of lumbar region without neurogenic claudication   5. Other spondylosis with radiculopathy, lumbar region     Plan: I was going to attempt conservative treatment with left shoulder injection.  Patient CBG in clinic 160 today.  I will have patient go to formal PT and they can work on left shoulder and lumbar spine.  Advised patient that she can contact her primary care provider to see if he is comfortable with prescribing a prednisone taper with her history of diabetes.  Patient will follow-up with Dr. Lorin Mercy in 4 weeks for recheck and he can decide if left shoulder injection or further imaging with MRI scans are indicated.  Follow-Up Instructions: Return in about 4 weeks (around 01/11/2022) for WITH DR [place] RECHECK LUMBAR AND LEFT SHOULDER.   Orders:  Orders Placed This Encounter  Procedures   XR Lumbar Spine 2-3 Views   XR Shoulder Left   Ambulatory referral to Physical Therapy   No orders of the defined types were placed in this encounter.     Procedures: No procedures performed   Clinical Data: No additional findings.   Subjective: Chief Complaint  Patient presents with   Lower Back - Pain   Left Shoulder - Pain    HPI 66 year old female comes in with complaints of left shoulder pain and low back pain and intermittent bilateral lower extremity radiculopathy.  Patient was previous patient of Dr. Eunice Blase and he was treating her for chronic lumbar spine issues.  She had lumbar MRI  December 12, 2018 at [facility] and that showed:  EXAMINATION: MRI lumbar spine without contrast   CLINICAL INDICATION: Back pain with left-sided sciatica   TECHNIQUE: MRI lumbar spine protocol without contrast.   COMPARISON: None   FINDINGS:   Bone marrow signal: Normal   Conus medullaris and cauda equina: Normal   L1-L2: Normal   L2-L3: There is moderate degenerative disc disease with a mild disc bulge. There is bilateral facet arthropathy with facet enlargement. There is moderate spinal stenosis.   L3-L4: There is moderate degenerative disc disease with a disc bulge and central disc protrusion. There is facet arthropathy with facet enlargement. This results in moderate to severe spinal stenosis.   L4-L5: There is mild degenerative disc disease with a disc bulge that extends more to the right. There is facet arthropathy with facet enlargement. The disc is causing moderate stenosis of the right lateral recess.   L5-S1: Normal    IMPRESSION:  1. There is a possible area of endometrial thickening. Correlate with pelvic ultrasound.  2. Moderate spinal stenosis at L2-L3 due to disc bulge and facet arthropathy  3. Moderate to severe spinal stenosis at L3-L4 due to disc bulge and facet arthropathy  4. Mild degenerative disc disease at L4-L5 with disc bulge that extends more to the right. There is facet arthropathy  with facet enlargement and moderate stenosis of the right lateral recess.    States that she has had pain in the low back with radiation into the bilateral upper legs x3 months.  No injury.  Primary care provider has prescribed Flexeril.  She also takes Tylenol.  States that she has had lumbar ESI's in the past.  Also complaining of left shoulder pain for a few months.  Pain when she raises her arm overhead and sometimes does bother her when she is asleep at night.  No injury.  Review of Systems No current cardiopulmonary GI/GU issues  Objective: Vital Signs: There were no  vitals taken for this visit.  Physical Exam HENT:     Head: Normocephalic and atraumatic.  Eyes:     Extraocular Movements: Extraocular movements intact.  Pulmonary:     Effort: No respiratory distress.  Musculoskeletal:     Comments: Gait is normal.  Good cervical spine range of motion.  Bilateral shoulders good range of motion.  Left shoulder positive impingement test.  Negative drop arm test.  Some pain with supraspinatus resistance.  Mild lumbar paraspinal tenderness.  No sciatic notch tenderness.  Negative logroll bilateral hips.  Negative straight leg raise.  No focal motor deficits.  Neurological:     General: No focal deficit present.     Mental Status: She is alert.  Psychiatric:        Mood and Affect: Mood normal.     Ortho Exam  Specialty Comments:  No specialty comments available.  Imaging: No results found.   PMFS History: Patient Active Problem List   Diagnosis Date Noted   Dyslipidemia 08/12/2021   Spinal stenosis of lumbar region 06/08/2019   Annual physical exam 12/07/2018   Insomnia 12/01/2017   RLS (restless legs syndrome) 11/03/2017   Anxiety 11/03/2017   Colon polyps 11/03/2017   PCP NOTES >>>>>>>>>>>>>>>>>>>>>> 06/01/2017   Diabetes mellitus ([place])    Hypertension    Thyroid disease    Past Medical History:  Diagnosis Date   Diabetes mellitus ([place])    Hypertension    Thyroid disease     Family History  Problem Relation Age of Onset   Arthritis Mother    Diabetes Father    CAD Neg Hx    Colon cancer Neg Hx    Breast cancer Neg Hx    Esophageal cancer Neg Hx    Stomach cancer Neg Hx    Rectal cancer Neg Hx     Past Surgical History:  Procedure Laterality Date   CESAREAN SECTION  1941,[id number]   X2   COLONOSCOPY  10 years ago    in [place]="normal exam" per pt   Social History   Occupational History   Occupation: retired - Education officer, museum , [place]  Tobacco Use   Smoking status: Former    Types: Cigarettes    Quit date: 2000     Years since quitting: 23.8   Smokeless tobacco: Never   Tobacco comments:    light smoker, quit ~ 2000  Vaping Use   Vaping Use: Never used  Substance and Sexual Activity   Alcohol use: No   Drug use: Never   Sexual activity: Not on file

## 2021-12-28 NOTE — Therapy (Signed)
OUTPATIENT PHYSICAL THERAPY EVALUATION   Patient Name: Kimberly Combs MRN: [id number] DOB:[birth year], 66 y.o., female Today's Date: 12/29/2021  END OF SESSION:  PT End of Session - 12/29/21 0935     Visit Number 1    Number of Visits 20    Date for PT Re-Evaluation 03/09/22    Authorization Type Medicare, BCBS    Progress Note Due on Visit 10    PT Start Time 0935    PT Stop Time 1016    PT Time Calculation (min) 41 min    Activity Tolerance Patient tolerated treatment well    Behavior During Therapy WFL for tasks assessed/performed             Past Medical History:  Diagnosis Date   Diabetes mellitus ([place])    Hypertension    Thyroid disease    Past Surgical History:  Procedure Laterality Date   CESAREAN SECTION  5093,2671   X2   COLONOSCOPY  10 years ago    in [place]="normal exam" per pt   Patient Active Problem List   Diagnosis Date Noted   Dyslipidemia 08/12/2021   Spinal stenosis of lumbar region 06/08/2019   Annual physical exam 12/07/2018   Insomnia 12/01/2017   RLS (restless legs syndrome) 11/03/2017   Anxiety 11/03/2017   Colon polyps 11/03/2017   PCP NOTES >>>>>>>>>>>>>>>>>>>>>> 06/01/2017   Diabetes mellitus ([place])    Hypertension    Thyroid disease     PCP: Kathlene November MD  REFERRING PROVIDER: Lanae Crumbly, PA-C  REFERRING DIAG: I45.80,D98.33 (ICD-10-CM) - Chronic low back pain, unspecified back pain laterality, unspecified whether sciatica present M25.512,G89.29 (ICD-10-CM) - Chronic left shoulder pain M75.42 (ICD-10-CM) - Impingement syndrome of left shoulder M47.26 (ICD-10-CM) - Other spondylosis with radiculopathy, lumbar region  Rationale for Evaluation and Treatment: Rehabilitation  THERAPY DIAG:  Pain in left arm  Other low back pain  Pain in left leg  Pain in right leg  Muscle weakness (generalized)  ONSET DATE: Approx. Aug 2023  SUBJECTIVE:                                                                                                                                                                                            SUBJECTIVE STATEMENT: Pt indicated complaints in Lt shoulder into Lt arm into hands/fingers (worse at night with trouble sleeping) - onset about 3 months)  Pt indicated complaints in lower back (which led to visit with PA) that has improved some with rest and medicine.  Pt also indicated having onset of pain in both legs from hips and thighs (feels like needles/electric shock) - onset about a  week ago .  All symptoms insidious onset.  Imaging was performed on back in past (see below)   PERTINENT HISTORY:  DM, HTN  PAIN:  NPRS scale: Lt shoulder/arm at worst 8-9/10, back 8-9/10 Pain location: Lt shoulder, low back  Pain description: shoulder arm (pain/numbness), back /hips( electric shock, heavy) Aggravating factors: Lt shoulder/arm (sleeping), back / legs (sitting/standing transfer, prolonged standing) Relieving factors: tried medicine, ice, rest for all.  Sometimes helps  PRECAUTIONS: None  WEIGHT BEARING RESTRICTIONS: No  FALLS:  Has patient fallen in last 6 months? No  LIVING ENVIRONMENT: Lives in: House/apartment Stairs: bedroom on second floor   OCCUPATION: Retired   PLOF: Independent, Rt hand dominant, housework activity, yard activity, previously went to gym (bike, [employer] for whole body)  PATIENT GOALS: Reduce pain, would like to return to gym  Next MD Visit:    OBJECTIVE:   DIAGNOSTIC FINDINGS:  12/29/2021 review:  IMPRESSION:  1. There is a possible area of endometrial thickening. Correlate with pelvic ultrasound.  2. Moderate spinal stenosis at L2-L3 due to disc bulge and facet arthropathy  3. Moderate to severe spinal stenosis at L3-L4 due to disc bulge and facet arthropathy  4. Mild degenerative disc disease at L4-L5 with disc bulge that extends more to the right. There is facet arthropathy with facet enlargement and moderate stenosis of the right  lateral recess.   PATIENT SURVEYS:  12/29/2021 FOTO eval:  47  predicted:  61  SCREENING FOR RED FLAGS: 12/29/2021 Bowel or bladder incontinence: No Cauda equina syndrome: No  COGNITION: 12/29/2021 Overall cognitive status: WFL normal      SENSATION: 12/29/2021 Not checked today  MUSCLE LENGTH: 12/29/2021 No specific testing  POSTURE:  12/29/2021 Mild FHP, rounded shoulders.  Mild reduced lumbar lordosis in standing.   PALPATION: 12/29/2021 Trigger points c concordant shoulder/UE symptoms from Lt infraspinatus.   CERVICAL ROM:   ROM AROM (deg) 12/29/2021  Flexion 50 c no change  Extension 65 c no change  Right lateral flexion   Left lateral flexion   Right rotation 79 c pulling on Lt upper trap region/shoulder  Left rotation 75 c no change   (Blank rows = not tested)   LUMBAR ROM:   AROM 12/29/2021  Flexion To mid shin, pulling in back and both legs, painful arc upon return  Extension 75 % WFL  Right lateral flexion To knee joint, pulling on Lt lumbar  Left lateral flexion To knee joint, pulling Rt lumbar  Right rotation   Left rotation    (Blank rows = not tested)  ROM:      Right 12/29/2021 Left 12/29/2021  Hip flexion    Hip extension    Hip abduction    Hip adduction    Hip internal rotation    Hip external rotation        Shoulder flexion [facility]  Shoulder Abduction    Shoulder ER    Shoulder IR         (Blank rows = not tested)  MMT:    MMT Right 12/29/2021 Left 12/29/2021  Hip flexion    Hip extension    Hip abduction    Hip adduction    Hip internal rotation    Hip external rotation    Knee flexion        Knee extension    Ankle dorsiflexion        Shoulder flexion 5/5 5/5 c pain Lt shoulder  Shoulder abduction 5/5 5/5 c pain Lt  shoulder  Shoulder ER 5/5 4/5 c pain Lt shoulder  Shoulder IR 5/5 5/5   (Blank rows = not tested)  SPECIAL TESTS:  12/29/2021 (-) Cervical spurling's compression bilaterally    FUNCTIONAL TESTS:  12/29/2021 None performed  GAIT: 12/29/2021 Independent ambulation  TODAY'S TREATMENT:                                                                                                                              DATE: 12/29/2021  Therex:    HEP instruction/performance c cues for techniques, handout provided.  Trial set performed of each for comprehension and symptom assessment.  See below for exercise list  Manual Compression to Lt infraspinatus c ER active movement in sitting for trigger point release  PATIENT EDUCATION:  12/29/2021 Education details: HEP, POC Person educated: Patient Education method: Consulting civil engineer, Demonstration, Verbal cues, and Handouts Education comprehension: verbalized understanding, returned demonstration, and verbal cues required  HOME EXERCISE PROGRAM: Access Code: [email or web address] URL: [email or web address] Date: 12/29/2021 Prepared by: Scot Jun  Exercises - Supine Lower Trunk Rotation  - 2-3 x daily - 7 x weekly - 1 sets - 3-5 reps - 15 hold - Standing Lumbar Extension with Counter  - 3-5 x daily - 7 x weekly - 1 sets - 5-10 reps - Seated Scapular Retraction  - 3-5 x daily - 7 x weekly - 1 sets - 5-10 reps - 3-5 hold - Standing Shoulder Posterior Capsule Stretch  - 2-3 x daily - 7 x weekly - 1 sets - 5 reps - 15 hold - Shoulder External Rotation with Anchored Resistance  - 1-2 x daily - 7 x weekly - 1-2 sets - 10 reps  ASSESSMENT:  CLINICAL IMPRESSION: Patient is a 66 y.o. who comes to clinic with complaints of Lt shoulder pain, low back pain with mobility, strength and movement coordination deficits that impair their ability to perform usual daily and recreational functional activities without increase difficulty/symptoms at this time.  At this time upon evaluation, cervical screening did not impact Lt UE symptoms.  Patient to benefit from skilled PT services to address impairments and limitations to improve to  previous level of function without restriction secondary to condition.   OBJECTIVE IMPAIRMENTS: decreased activity tolerance, decreased coordination, decreased endurance, decreased mobility, decreased ROM, decreased strength, increased fascial restrictions, impaired perceived functional ability, increased muscle spasms, impaired flexibility, impaired UE functional use, improper body mechanics, and pain.   ACTIVITY LIMITATIONS: carrying, lifting, bending, sitting, standing, squatting, sleeping, transfers, and reach over head  PARTICIPATION LIMITATIONS: meal prep, cleaning, laundry, interpersonal relationship, shopping, community activity, and yard work  PERSONAL FACTORS: Time since onset of injury/illness/exacerbation and 3+ comorbidities: DM, HTN, multiple involved body parts  are also affecting patient's functional outcome.   REHAB POTENTIAL:Good  CLINICAL DECISION MAKING: Evolving/moderate complexity  EVALUATION COMPLEXITY: Moderate   GOALS: Goals reviewed with patient? Yes  SHORT TERM GOALS: (target date for Short term goals are 3 weeks 01/19/2022)  1.  Patient will demonstrate independent use of home exercise program to maintain progress from in clinic treatments.  Goal status: New  LONG TERM GOALS: (target dates for all long term goals are 10 weeks  03/09/2022 )   1. Patient will demonstrate/report pain at worst less than or equal to 2/10 to facilitate minimal limitation in daily activity secondary to pain symptoms.  Goal status: New   2. Patient will demonstrate independent use of home exercise program to facilitate ability to maintain/progress functional gains from skilled physical therapy services.  Goal status: New   3. Patient will demonstrate FOTO outcome > or = 61 % to indicate reduced disability due to condition.  Goal status: New   4. Patient will demonstrate lumbar extension 100 % WFL s symptoms to facilitate upright standing, walking posture at PLOF s  limitation.  Goal status: New   5.  Patient will demonstrate Lt shoulder MMT 5/5 throughout s symptoms to facilitate usual arm movement, lifting at PLOF s limitation.   Goal status: New   6.  Patient will demonstrate/report ability to sleep s restriction due to symptoms.  Goal status: New   7.  Patient will demonstrate/report ability to perform house/yard activity at PLOF s restriction due to symptoms.  Goal Status: New  PLAN:  PT FREQUENCY: 1-2x/week  PT DURATION: 10 weeks  PLANNED INTERVENTIONS: Therapeutic exercises, Therapeutic activity, Neuro Muscular re-education, Balance training, Gait training, Patient/Family education, Joint mobilization, Stair training, DME instructions, Dry Needling, Electrical stimulation, Cryotherapy, vasopneumatic device, Moist heat, Taping, Traction Ultrasound, Ionotophoresis '4mg'$ /ml Dexamethasone, and Manual therapy.  All included unless contraindicated  PLAN FOR NEXT SESSION: Review HEP knowledge/results.   Possible dry needling to Lt infraspinatus.  Check upper limb tension testing Lt .    Scot Jun, PT, DPT, OCS, ATC 12/29/21  10:41 AM

## 2021-12-29 ENCOUNTER — Ambulatory Visit (INDEPENDENT_AMBULATORY_CARE_PROVIDER_SITE_OTHER): Payer: Medicare Other | Admitting: Rehabilitative and Restorative Service Providers"

## 2021-12-29 ENCOUNTER — Other Ambulatory Visit: Payer: Self-pay

## 2021-12-29 ENCOUNTER — Encounter: Payer: Self-pay | Admitting: Rehabilitative and Restorative Service Providers"

## 2021-12-29 DIAGNOSIS — M79604 Pain in right leg: Secondary | ICD-10-CM

## 2021-12-29 DIAGNOSIS — M6281 Muscle weakness (generalized): Secondary | ICD-10-CM

## 2021-12-29 DIAGNOSIS — M5459 Other low back pain: Secondary | ICD-10-CM

## 2021-12-29 DIAGNOSIS — M79605 Pain in left leg: Secondary | ICD-10-CM | POA: Diagnosis not present

## 2021-12-29 DIAGNOSIS — M79602 Pain in left arm: Secondary | ICD-10-CM

## 2022-01-04 ENCOUNTER — Ambulatory Visit (INDEPENDENT_AMBULATORY_CARE_PROVIDER_SITE_OTHER): Payer: Medicare Other | Admitting: Rehabilitative and Restorative Service Providers"

## 2022-01-04 ENCOUNTER — Encounter: Payer: Self-pay | Admitting: Rehabilitative and Restorative Service Providers"

## 2022-01-04 DIAGNOSIS — M79605 Pain in left leg: Secondary | ICD-10-CM

## 2022-01-04 DIAGNOSIS — M79604 Pain in right leg: Secondary | ICD-10-CM

## 2022-01-04 DIAGNOSIS — M79602 Pain in left arm: Secondary | ICD-10-CM | POA: Diagnosis not present

## 2022-01-04 DIAGNOSIS — M6281 Muscle weakness (generalized): Secondary | ICD-10-CM

## 2022-01-04 DIAGNOSIS — M5459 Other low back pain: Secondary | ICD-10-CM | POA: Diagnosis not present

## 2022-01-04 NOTE — Therapy (Signed)
OUTPATIENT PHYSICAL THERAPY TREATMENT   Patient Name: Kimberly Combs MRN: [id number] DOB:[birth year], 66 y.o., female Today's Date: 01/04/2022  END OF SESSION:  PT End of Session - 01/04/22 0934     Visit Number 2    Number of Visits 20    Date for PT Re-Evaluation 03/09/22    Authorization Type Medicare, BCBS    Progress Note Due on Visit 10    PT Start Time 0930    PT Stop Time 1010    PT Time Calculation (min) 40 min    Activity Tolerance Patient tolerated treatment well    Behavior During Therapy WFL for tasks assessed/performed              Past Medical History:  Diagnosis Date   Diabetes mellitus ([place])    Hypertension    Thyroid disease    Past Surgical History:  Procedure Laterality Date   CESAREAN SECTION  8592,9244   X2   COLONOSCOPY  10 years ago    in [place]="normal exam" per pt   Patient Active Problem List   Diagnosis Date Noted   Dyslipidemia 08/12/2021   Spinal stenosis of lumbar region 06/08/2019   Annual physical exam 12/07/2018   Insomnia 12/01/2017   RLS (restless legs syndrome) 11/03/2017   Anxiety 11/03/2017   Colon polyps 11/03/2017   PCP NOTES >>>>>>>>>>>>>>>>>>>>>> 06/01/2017   Diabetes mellitus ([place])    Hypertension    Thyroid disease     PCP: Kathlene November MD  REFERRING PROVIDER: Lanae Crumbly, PA-C  REFERRING DIAG: Q28.63,O17.71 (ICD-10-CM) - Chronic low back pain, unspecified back pain laterality, unspecified whether sciatica present M25.512,G89.29 (ICD-10-CM) - Chronic left shoulder pain M75.42 (ICD-10-CM) - Impingement syndrome of left shoulder M47.26 (ICD-10-CM) - Other spondylosis with radiculopathy, lumbar region  Rationale for Evaluation and Treatment: Rehabilitation  THERAPY DIAG:  Pain in left arm  Other low back pain  Pain in left leg  Pain in right leg  Muscle weakness (generalized)  ONSET DATE: Approx. Aug 2023  SUBJECTIVE:                                                                                                                                                                                            SUBJECTIVE STATEMENT: Pt indicated feeling better in shoulder but did notice complaints in Lt arm continued , specifically at nighttime.   Pt indicated complaints in thighs with lower chairs.  Pt also indicated walking causing pain in Rt leg (needle like) that causes her to stop.    PERTINENT HISTORY:  DM, HTN  PAIN:  NPRS scale: Lt shoulder/arm at worst 8-9/10, back 8-9/10  Pain location: Lt shoulder, low back  Pain description: Shoulder - numbness, tingling Aggravating factors: Lt shoulder/arm (sleeping, steady holding of phone), back / legs (sitting/standing transfer, prolonged standing) Relieving factors: tried medicine, ice, rest for all.  Sometimes helps  PRECAUTIONS: None  WEIGHT BEARING RESTRICTIONS: No  FALLS:  Has patient fallen in last 6 months? No  LIVING ENVIRONMENT: Lives in: House/apartment Stairs: bedroom on second floor   OCCUPATION: Retired   PLOF: Independent, Rt hand dominant, housework activity, yard activity, previously went to gym (bike, [employer] for whole body)  PATIENT GOALS: Reduce pain, would like to return to gym  Next MD Visit:    OBJECTIVE:   DIAGNOSTIC FINDINGS:  12/29/2021 review:  IMPRESSION:  1. There is a possible area of endometrial thickening. Correlate with pelvic ultrasound.  2. Moderate spinal stenosis at L2-L3 due to disc bulge and facet arthropathy  3. Moderate to severe spinal stenosis at L3-L4 due to disc bulge and facet arthropathy  4. Mild degenerative disc disease at L4-L5 with disc bulge that extends more to the right. There is facet arthropathy with facet enlargement and moderate stenosis of the right lateral recess.   PATIENT SURVEYS:  12/29/2021 FOTO eval:  47  predicted:  61  SCREENING FOR RED FLAGS: 12/29/2021 Bowel or bladder incontinence: No Cauda equina syndrome: No  COGNITION: 12/29/2021 Overall  cognitive status: WFL normal      SENSATION: 12/29/2021 Not checked today  MUSCLE LENGTH: 12/29/2021 No specific testing  POSTURE:  12/29/2021 Mild FHP, rounded shoulders.  Mild reduced lumbar lordosis in standing.   PALPATION: 12/29/2021 Trigger points c concordant shoulder/UE symptoms from Lt infraspinatus.   CERVICAL ROM:   ROM AROM (deg) 12/29/2021  Flexion 50 c no change  Extension 65 c no change  Right lateral flexion   Left lateral flexion   Right rotation 79 c pulling on Lt upper trap region/shoulder  Left rotation 75 c no change   (Blank rows = not tested)   LUMBAR ROM:   AROM 12/29/2021  Flexion To mid shin, pulling in back and both legs, painful arc upon return  Extension 75 % WFL  Right lateral flexion To knee joint, pulling on Lt lumbar  Left lateral flexion To knee joint, pulling Rt lumbar  Right rotation   Left rotation    (Blank rows = not tested)  ROM:      Right 12/29/2021 Left 12/29/2021  Hip flexion    Hip extension    Hip abduction    Hip adduction    Hip internal rotation    Hip external rotation        Shoulder flexion [facility]  Shoulder Abduction    Shoulder ER    Shoulder IR         (Blank rows = not tested)  MMT:    MMT Right 12/29/2021 Left 12/29/2021  Hip flexion    Hip extension    Hip abduction    Hip adduction    Hip internal rotation    Hip external rotation    Knee flexion        Knee extension    Ankle dorsiflexion        Shoulder flexion 5/5 5/5 c pain Lt shoulder  Shoulder abduction 5/5 5/5 c pain Lt shoulder  Shoulder ER 5/5 4/5 c pain Lt shoulder  Shoulder IR 5/5 5/5   (Blank rows = not tested)  SPECIAL TESTS:  12/29/2021 (-) Cervical spurling's compression bilaterally   FUNCTIONAL TESTS:  12/29/2021 None performed  GAIT: 12/29/2021 Independent ambulation  TODAY'S TREATMENT:                                                                                                                               DATE: 01/04/2022  Therex: Cross arm stretch Lt shoulder seated c moist heat on Lt shoulder 15 sec x 5 Standing green band rows c scapular retraction 2 x 10 c moist heat on Lt shoulder Standing green band ER 2 x 10 c towel under arm  Standing lumbar extension AROM x 5 Sit to stand to sit transfer s UE assist 18 inch chair x 10 c slow lowering focus - 21 inch table height  Manual Compression to Lt infraspinatus  Trigger Point Dry-Needling  Treatment instructions: Expect mild to moderate muscle soreness. S/S of pneumothorax if dry needled over a lung field, and to seek immediate medical attention should they occur. Patient verbalized understanding of these instructions and education.  Patient Consent Given: Yes Education handout provided: Previously provided Muscles treated: Lt infraspinatus Treatment response/outcome: twitch response c concordant symptoms noted, no adverse reaction   TODAY'S TREATMENT:                                                                                                                              DATE: 12/29/2021  Therex:    HEP instruction/performance c cues for techniques, handout provided.  Trial set performed of each for comprehension and symptom assessment.  See below for exercise list  Manual Compression to Lt infraspinatus c ER active movement in sitting for trigger point release  PATIENT EDUCATION:  12/29/2021 Education details: HEP update, DN information Person educated: Patient Education method: Explanation, Demonstration, Verbal cues, and Handouts Education comprehension: verbalized understanding, returned demonstration, and verbal cues required  HOME EXERCISE PROGRAM: Access Code: [email or web address] URL: [email or web address] Date: 01/04/2022 Prepared by: Scot Jun  Exercises - Supine Lower Trunk Rotation  - 2-3 x daily - 7 x weekly - 1 sets - 3-5 reps - 15 hold - Standing Lumbar Extension with Counter  - 3-5 x daily -  7 x weekly - 1 sets - 5-10 reps - Seated Scapular Retraction  - 3-5 x daily - 7 x weekly - 1 sets - 5-10 reps - 3-5 hold - Standing Shoulder Posterior Capsule Stretch  - 2-3 x daily - 7 x weekly - 1 sets - 5 reps -  15 hold - Shoulder External Rotation with Anchored Resistance  - 1-2 x daily - 7 x weekly - 1-2 sets - 10 reps - Standing Shoulder Row with Anchored Resistance  - 1-2 x daily - 7 x weekly - 1-2 sets - 10-15 reps - Sit to Stand  - 3 x daily - 7 x weekly - 1 sets - 10 reps  ASSESSMENT:  CLINICAL IMPRESSION: Fair tolerance in dry needling/manual for Lt shoulder trigger point release techniques.  Concordant shoulder and UE symptoms noted during twitch response from needling.  Reviewed existing HEP and progressed to include strengthening in lower extremity.    OBJECTIVE IMPAIRMENTS: decreased activity tolerance, decreased coordination, decreased endurance, decreased mobility, decreased ROM, decreased strength, increased fascial restrictions, impaired perceived functional ability, increased muscle spasms, impaired flexibility, impaired UE functional use, improper body mechanics, and pain.   ACTIVITY LIMITATIONS: carrying, lifting, bending, sitting, standing, squatting, sleeping, transfers, and reach over head  PARTICIPATION LIMITATIONS: meal prep, cleaning, laundry, interpersonal relationship, shopping, community activity, and yard work  PERSONAL FACTORS: Time since onset of injury/illness/exacerbation and 3+ comorbidities: DM, HTN, multiple involved body parts  are also affecting patient's functional outcome.   REHAB POTENTIAL:Good  CLINICAL DECISION MAKING: Evolving/moderate complexity  EVALUATION COMPLEXITY: Moderate   GOALS: Goals reviewed with patient? Yes  SHORT TERM GOALS: (target date for Short term goals are 3 weeks 01/19/2022)  1. Patient will demonstrate independent use of home exercise program to maintain progress from in clinic treatments.  Goal status: on going  01/04/2022  LONG TERM GOALS: (target dates for all long term goals are 10 weeks  03/09/2022 )   1. Patient will demonstrate/report pain at worst less than or equal to 2/10 to facilitate minimal limitation in daily activity secondary to pain symptoms.  Goal status: New   2. Patient will demonstrate independent use of home exercise program to facilitate ability to maintain/progress functional gains from skilled physical therapy services.  Goal status: New   3. Patient will demonstrate FOTO outcome > or = 61 % to indicate reduced disability due to condition.  Goal status: New   4. Patient will demonstrate lumbar extension 100 % WFL s symptoms to facilitate upright standing, walking posture at PLOF s limitation.  Goal status: New   5.  Patient will demonstrate Lt shoulder MMT 5/5 throughout s symptoms to facilitate usual arm movement, lifting at PLOF s limitation.   Goal status: New   6.  Patient will demonstrate/report ability to sleep s restriction due to symptoms.  Goal status: New   7.  Patient will demonstrate/report ability to perform house/yard activity at PLOF s restriction due to symptoms.  Goal Status: New  PLAN:  PT FREQUENCY: 1-2x/week  PT DURATION: 10 weeks  PLANNED INTERVENTIONS: Therapeutic exercises, Therapeutic activity, Neuro Muscular re-education, Balance training, Gait training, Patient/Family education, Joint mobilization, Stair training, DME instructions, Dry Needling, Electrical stimulation, Cryotherapy, vasopneumatic device, Moist heat, Taping, Traction Ultrasound, Ionotophoresis '4mg'$ /ml Dexamethasone, and Manual therapy.  All included unless contraindicated  PLAN FOR NEXT SESSION: DN as desired for Lt shoulder based off results.  Progress more with back/hip/thigh treatment as shoulder/UE improves.    Scot Jun, PT, DPT, OCS, ATC 01/04/22  10:08 AM

## 2022-01-09 ENCOUNTER — Other Ambulatory Visit: Payer: Self-pay | Admitting: Internal Medicine

## 2022-01-12 ENCOUNTER — Telehealth: Payer: Self-pay | Admitting: Internal Medicine

## 2022-01-12 MED ORDER — METFORMIN HCL ER 750 MG PO TB24: ORAL_TABLET | ORAL | 1 refills | Status: DC

## 2022-01-12 MED ORDER — SYNTHROID 125 MCG PO TABS: 125.0000 ug | ORAL_TABLET | Freq: Every day | ORAL | 1 refills | Status: DC

## 2022-01-12 NOTE — Telephone Encounter (Signed)
Rxs sent

## 2022-01-12 NOTE — Telephone Encounter (Signed)
Patient called stating that per her pharmacy, she needs a new script for all her medications. Please advise.    [id number] [address], [address] AT [facility] OF [address] [place] [address] [place] [address] Phone: [phone]  Fax: [phone]

## 2022-01-19 ENCOUNTER — Encounter: Payer: Medicare Other | Admitting: Rehabilitative and Restorative Service Providers"

## 2022-01-28 ENCOUNTER — Ambulatory Visit (INDEPENDENT_AMBULATORY_CARE_PROVIDER_SITE_OTHER): Payer: Medicare Other | Admitting: Rehabilitative and Restorative Service Providers"

## 2022-01-28 ENCOUNTER — Encounter: Payer: Self-pay | Admitting: Rehabilitative and Restorative Service Providers"

## 2022-01-28 DIAGNOSIS — M6281 Muscle weakness (generalized): Secondary | ICD-10-CM

## 2022-01-28 DIAGNOSIS — M79602 Pain in left arm: Secondary | ICD-10-CM

## 2022-01-28 DIAGNOSIS — M79605 Pain in left leg: Secondary | ICD-10-CM

## 2022-01-28 DIAGNOSIS — M5459 Other low back pain: Secondary | ICD-10-CM

## 2022-01-28 DIAGNOSIS — M79604 Pain in right leg: Secondary | ICD-10-CM | POA: Diagnosis not present

## 2022-01-28 NOTE — Therapy (Signed)
OUTPATIENT PHYSICAL THERAPY TREATMENT   Patient Name: Kimberly Combs MRN: [id number] DOB:Aug 20, 1955, 66 y.o., female Today's Date: 01/28/2022  END OF SESSION:  PT End of Session - 01/28/22 1000     Visit Number 3    Number of Visits 20    Date for PT Re-Evaluation 03/09/22    Authorization Type Medicare, BCBS    Progress Note Due on Visit 10    PT Start Time 0930    PT Stop Time 1009    PT Time Calculation (min) 39 min    Activity Tolerance Patient tolerated treatment well    Behavior During Therapy WFL for tasks assessed/performed               Past Medical History:  Diagnosis Date   Diabetes mellitus ([place])    Hypertension    Thyroid disease    Past Surgical History:  Procedure Laterality Date   CESAREAN SECTION  5929,2446   X2   COLONOSCOPY  10 years ago    in [place]="normal exam" per pt   Patient Active Problem List   Diagnosis Date Noted   Dyslipidemia 08/12/2021   Spinal stenosis of lumbar region 06/08/2019   Annual physical exam 12/07/2018   Insomnia 12/01/2017   RLS (restless legs syndrome) 11/03/2017   Anxiety 11/03/2017   Colon polyps 11/03/2017   PCP NOTES >>>>>>>>>>>>>>>>>>>>>> 06/01/2017   Diabetes mellitus ([place])    Hypertension    Thyroid disease     PCP: Kathlene November MD  REFERRING PROVIDER: Lanae Crumbly, PA-C  REFERRING DIAG: K86.38,T77.11 (ICD-10-CM) - Chronic low back pain, unspecified back pain laterality, unspecified whether sciatica present M25.512,G89.29 (ICD-10-CM) - Chronic left shoulder pain M75.42 (ICD-10-CM) - Impingement syndrome of left shoulder M47.26 (ICD-10-CM) - Other spondylosis with radiculopathy, lumbar region  Rationale for Evaluation and Treatment: Rehabilitation  THERAPY DIAG:  Pain in left arm  Other low back pain  Pain in left leg  Pain in right leg  Muscle weakness (generalized)  ONSET DATE: Approx. Aug 2023  SUBJECTIVE:                                                                                                                                                                                            SUBJECTIVE STATEMENT: Pt indicated feeling better in shoulder but did notice complaints in Lt arm continued , specifically at nighttime.   Pt indicated complaints in thighs with lower chairs.  Pt also indicated walking causing pain in Rt leg (needle like) that causes her to stop.    PERTINENT HISTORY:  DM, HTN  PAIN:  NPRS scale: 7/10 (RLE)  4-5/10 in Lt  forearm Pain location: Rt thigh/knee, Lt forearm Pain description:  RLE - tight, pressure,  forearm - numbness, tingling Aggravating factors: Lt shoulder/arm (sleeping, steady holding of phone), back / legs (sitting/standing transfer, prolonged standing) Relieving factors: tried medicine, ice, rest for all.  Sometimes helps  PRECAUTIONS: None  WEIGHT BEARING RESTRICTIONS: No  FALLS:  Has patient fallen in last 6 months? No  LIVING ENVIRONMENT: Lives in: House/apartment Stairs: bedroom on second floor   OCCUPATION: Retired   PLOF: Independent, Rt hand dominant, housework activity, yard activity, previously went to gym (bike, [employer] for whole body)  PATIENT GOALS: Reduce pain, would like to return to gym  Next MD Visit:    OBJECTIVE:   DIAGNOSTIC FINDINGS:  12/29/2021 review:  IMPRESSION:  1. There is a possible area of endometrial thickening. Correlate with pelvic ultrasound.  2. Moderate spinal stenosis at L2-L3 due to disc bulge and facet arthropathy  3. Moderate to severe spinal stenosis at L3-L4 due to disc bulge and facet arthropathy  4. Mild degenerative disc disease at L4-L5 with disc bulge that extends more to the right. There is facet arthropathy with facet enlargement and moderate stenosis of the right lateral recess.   PATIENT SURVEYS:  01/28/2022:  FOTO update: 46  12/29/2021 FOTO eval:  47  predicted:  61  SCREENING FOR RED FLAGS: 12/29/2021 Bowel or bladder incontinence: No Cauda equina  syndrome: No  COGNITION: 12/29/2021 Overall cognitive status: WFL normal      SENSATION: 12/29/2021 Not checked today  MUSCLE LENGTH: 12/29/2021 No specific testing  POSTURE:  12/29/2021 Mild FHP, rounded shoulders.  Mild reduced lumbar lordosis in standing.   PALPATION: 12/29/2021 Trigger points c concordant shoulder/UE symptoms from Lt infraspinatus.   CERVICAL ROM:   ROM AROM (deg) 12/29/2021   Flexion 50 c no change   Extension 65 c no change   Right lateral flexion    Left lateral flexion    Right rotation 79 c pulling on Lt upper trap region/shoulder   Left rotation 75 c no change    (Blank rows = not tested)   LUMBAR ROM:   AROM 12/29/2021 01/28/2022  Flexion To mid shin, pulling in back and both legs, painful arc upon return To ankles with Rt knee pain   Extension 75 % WFL 75 % WFL lower back end range pain  Repeated x 5 in standing:  improved to 100 % c similar symptoms  Right lateral flexion To knee joint, pulling on Lt lumbar   Left lateral flexion To knee joint, pulling Rt lumbar   Right rotation    Left rotation     (Blank rows = not tested)  ROM:      Right 12/29/2021 Left 12/29/2021  Hip flexion    Hip extension    Hip abduction    Hip adduction    Hip internal rotation    Hip external rotation        Shoulder flexion [facility]  Shoulder Abduction    Shoulder ER    Shoulder IR         (Blank rows = not tested)  MMT:    MMT Right 12/29/2021 Left 12/29/2021 Right 01/28/2022  Hip flexion   5/5  Hip extension     Hip abduction   4/5  Hip adduction     Hip internal rotation     Hip external rotation     Knee flexion   5/5       Knee extension  5/5  Ankle dorsiflexion          Shoulder flexion 5/5 5/5 c pain Lt shoulder   Shoulder abduction 5/5 5/5 c pain Lt shoulder   Shoulder ER 5/5 4/5 c pain Lt shoulder   Shoulder IR 5/5 5/5    (Blank rows = not tested)  SPECIAL TESTS:  12/29/2021 (-) Cervical spurling's  compression bilaterally   FUNCTIONAL TESTS:  12/29/2021 None performed  GAIT: 12/29/2021 Independent ambulation  TODAY'S TREATMENT:                                                                                                                              DATE: 01/28/2022  Therex: Nustep Lvl 6 10 mins UE/LE Standing lumbar extension AROM x 5 Supine SLR Rt x 5, Lt x 2 Supine bridge x 10 (stopped due to pain complaints) Supine Thomas stretch for Rt hip flexors/quad 15 sec x 5 (added to HEP) Seated SLR x 10 bilateral (added to HEP) Sit to stand to sit 18 inch table height x 5  Additional time spent in review of existing HEP and review of additions for today.      TODAY'S TREATMENT:                                                                                                                              DATE: 01/04/2022  Therex: Cross arm stretch Lt shoulder seated c moist heat on Lt shoulder 15 sec x 5 Standing green band rows c scapular retraction 2 x 10 c moist heat on Lt shoulder Standing green band ER 2 x 10 c towel under arm  Standing lumbar extension AROM x 5 Sit to stand to sit transfer s UE assist 18 inch chair x 10 c slow lowering focus - 21 inch table height  Manual Compression to Lt infraspinatus  Trigger Point Dry-Needling  Treatment instructions: Expect mild to moderate muscle soreness. S/S of pneumothorax if dry needled over a lung field, and to seek immediate medical attention should they occur. Patient verbalized understanding of these instructions and education.  Patient Consent Given: Yes Education handout provided: Previously provided Muscles treated: Lt infraspinatus Treatment response/outcome: twitch response c concordant symptoms noted, no adverse reaction   TODAY'S TREATMENT:  DATE: 12/29/2021  Therex:    HEP  instruction/performance c cues for techniques, handout provided.  Trial set performed of each for comprehension and symptom assessment.  See below for exercise list  Manual Compression to Lt infraspinatus c ER active movement in sitting for trigger point release  PATIENT EDUCATION:  01/28/2022 Education details: HEP update Person educated: Patient Education method: Consulting civil engineer, Demonstration, Verbal cues, and Handouts Education comprehension: verbalized understanding, returned demonstration, and verbal cues required  HOME EXERCISE PROGRAM: Access Code: [email or web address] URL: [email or web address] Date: 01/28/2022 Prepared by: Scot Jun  Exercises - Supine Lower Trunk Rotation  - 2-3 x daily - 7 x weekly - 1 sets - 3-5 reps - 15 hold - Standing Lumbar Extension with Counter  - 3-5 x daily - 7 x weekly - 1 sets - 5-10 reps - Hip Flexor Stretch at Edge of Bed  - 1-2 x daily - 7 x weekly - 1 sets - 5 reps - 15 hold - Seated Scapular Retraction  - 3-5 x daily - 7 x weekly - 1 sets - 5-10 reps - 3-5 hold - Standing Shoulder Posterior Capsule Stretch  - 2-3 x daily - 7 x weekly - 1 sets - 5 reps - 15 hold - Shoulder External Rotation with Anchored Resistance  - 1-2 x daily - 7 x weekly - 1-2 sets - 10 reps - Standing Shoulder Row with Anchored Resistance  - 1-2 x daily - 7 x weekly - 1-2 sets - 10-15 reps - Sit to Stand  - 3 x daily - 7 x weekly - 1 sets - 10 reps - Clamshell  - 1-2 x daily - 7 x weekly - 3 sets - 10 reps - Seated Straight Leg Heel Taps  - 1-2 x daily - 7 x weekly - 3 sets - 10 reps  ASSESSMENT:  CLINICAL IMPRESSION: Indications of improvement for Lt shoulder symptoms.  Main complaints today and in last week was related to Rt thigh and knee symptoms noted by patient.  Adjusted intervention today to include treatment to that area including strengthening and hip flexor stretching to affected area.  Continued skilled PT services indicated at this time.     OBJECTIVE IMPAIRMENTS: decreased activity tolerance, decreased coordination, decreased endurance, decreased mobility, decreased ROM, decreased strength, increased fascial restrictions, impaired perceived functional ability, increased muscle spasms, impaired flexibility, impaired UE functional use, improper body mechanics, and pain.   ACTIVITY LIMITATIONS: carrying, lifting, bending, sitting, standing, squatting, sleeping, transfers, and reach over head  PARTICIPATION LIMITATIONS: meal prep, cleaning, laundry, interpersonal relationship, shopping, community activity, and yard work  PERSONAL FACTORS: Time since onset of injury/illness/exacerbation and 3+ comorbidities: DM, HTN, multiple involved body parts  are also affecting patient's functional outcome.   REHAB POTENTIAL:Good  CLINICAL DECISION MAKING: Evolving/moderate complexity  EVALUATION COMPLEXITY: Moderate   GOALS: Goals reviewed with patient? Yes  SHORT TERM GOALS: (target date for Short term goals are 3 weeks 01/19/2022)  1. Patient will demonstrate independent use of home exercise program to maintain progress from in clinic treatments.  Goal status: Met  LONG TERM GOALS: (target dates for all long term goals are 10 weeks  03/09/2022 )   1. Patient will demonstrate/report pain at worst less than or equal to 2/10 to facilitate minimal limitation in daily activity secondary to pain symptoms.  Goal status: on going 01/28/2022   2. Patient will demonstrate independent use of home exercise program to facilitate ability to maintain/progress functional gains from skilled physical therapy services.  Goal  status: on going 01/28/2022   3. Patient will demonstrate FOTO outcome > or = 61 % to indicate reduced disability due to condition.  Goal status: on going 01/28/2022   4. Patient will demonstrate lumbar extension 100 % WFL s symptoms to facilitate upright standing, walking posture at PLOF s limitation.  Goal status: on  going 01/28/2022   5.  Patient will demonstrate Lt shoulder MMT 5/5 throughout s symptoms to facilitate usual arm movement, lifting at PLOF s limitation.   Goal status: on going 01/28/2022   6.  Patient will demonstrate/report ability to sleep s restriction due to symptoms.  Goal status: on going 01/28/2022   7.  Patient will demonstrate/report ability to perform house/yard activity at PLOF s restriction due to symptoms.  Goal Status: on going 01/28/2022  PLAN:  PT FREQUENCY: 1-2x/week  PT DURATION: 10 weeks  PLANNED INTERVENTIONS: Therapeutic exercises, Therapeutic activity, Neuro Muscular re-education, Balance training, Gait training, Patient/Family education, Joint mobilization, Stair training, DME instructions, Dry Needling, Electrical stimulation, Cryotherapy, vasopneumatic device, Moist heat, Taping, Traction Ultrasound, Ionotophoresis 11m/ml Dexamethasone, and Manual therapy.  All included unless contraindicated  PLAN FOR NEXT SESSION: Recheck new exercise results.  Resume LUE symptoms treatment as able to improve numbness complaints.    MScot Jun PT, DPT, OCS, ATC 01/28/22  10:18 AM

## 2022-02-02 ENCOUNTER — Encounter: Payer: Medicare Other | Admitting: Rehabilitative and Restorative Service Providers"

## 2022-02-03 ENCOUNTER — Encounter: Payer: Medicare Other | Admitting: Rehabilitative and Restorative Service Providers"

## 2022-02-09 ENCOUNTER — Ambulatory Visit (INDEPENDENT_AMBULATORY_CARE_PROVIDER_SITE_OTHER): Payer: Medicare Other | Admitting: Rehabilitative and Restorative Service Providers"

## 2022-02-09 ENCOUNTER — Encounter: Payer: Self-pay | Admitting: Rehabilitative and Restorative Service Providers"

## 2022-02-09 DIAGNOSIS — M79605 Pain in left leg: Secondary | ICD-10-CM | POA: Diagnosis not present

## 2022-02-09 DIAGNOSIS — M79602 Pain in left arm: Secondary | ICD-10-CM | POA: Diagnosis not present

## 2022-02-09 DIAGNOSIS — M5459 Other low back pain: Secondary | ICD-10-CM

## 2022-02-09 DIAGNOSIS — M79604 Pain in right leg: Secondary | ICD-10-CM | POA: Diagnosis not present

## 2022-02-09 DIAGNOSIS — M6281 Muscle weakness (generalized): Secondary | ICD-10-CM

## 2022-02-09 NOTE — Therapy (Addendum)
OUTPATIENT PHYSICAL THERAPY TREATMENT /PROGRESS NOTE / DISCHARGE   Patient Name: Kimberly Combs MRN: [id number] DOB:[birth year], 67 y.o., female Today's Date: 02/09/2022  Progress Note Reporting Period 12/29/2021 to 02/09/2022  See note below for Objective Data and Assessment of Progress/Goals.      END OF SESSION:  PT End of Session - 02/09/22 0929     Visit Number 4    Number of Visits 20    Date for PT Re-Evaluation 03/09/22    Authorization Type Medicare, BCBS    Progress Note Due on Visit 10    PT Start Time 0930    PT Stop Time 1008    PT Time Calculation (min) 38 min    Activity Tolerance Patient limited by pain    Behavior During Therapy [facility] for tasks assessed/performed                Past Medical History:  Diagnosis Date   Diabetes mellitus ([place])    Hypertension    Thyroid disease    Past Surgical History:  Procedure Laterality Date   CESAREAN SECTION  7948,0165   X2   COLONOSCOPY  10 years ago    in [place]="normal exam" per pt   Patient Active Problem List   Diagnosis Date Noted   Dyslipidemia 08/12/2021   Spinal stenosis of lumbar region 06/08/2019   Annual physical exam 12/07/2018   Insomnia 12/01/2017   RLS (restless legs syndrome) 11/03/2017   Anxiety 11/03/2017   Colon polyps 11/03/2017   PCP NOTES >>>>>>>>>>>>>>>>>>>>>> 06/01/2017   Diabetes mellitus ([place])    Hypertension    Thyroid disease     PCP: Kathlene November MD  REFERRING PROVIDER: Lanae Crumbly, PA-C  REFERRING DIAG: V37.48,O70.78 (ICD-10-CM) - Chronic low back pain, unspecified back pain laterality, unspecified whether sciatica present M25.512,G89.29 (ICD-10-CM) - Chronic left shoulder pain M75.42 (ICD-10-CM) - Impingement syndrome of left shoulder M47.26 (ICD-10-CM) - Other spondylosis with radiculopathy, lumbar region  Rationale for Evaluation and Treatment: Rehabilitation  THERAPY DIAG:  Pain in left arm  Other low back pain  Pain in left leg  Pain in right leg  Muscle  weakness (generalized)  ONSET DATE: Approx. Aug 2023  SUBJECTIVE:                                                                                                                                                                                           SUBJECTIVE STATEMENT: Pt indicated feeling like the last week she has been having similar pain in all areas as she did prior to therapy.  Reported consistent pain during the day and difficulty.    Pt described  a lot of her pain as electric pain shooting that can be severe.     PERTINENT HISTORY:  DM, HTN  PAIN:  NPRS scale: "severe pain reported " but no specific number Pain location: back, Rt thigh/knee, Lt forearm Pain description:  RLE - tight, pressure,  forearm - numbness, tingling Aggravating factors: Lt shoulder/arm (sleeping, steady holding of phone), back / legs (sitting/standing transfer, prolonged standing) Relieving factors: tried medicine, ice, rest for all.  Sometimes helps  PRECAUTIONS: None  WEIGHT BEARING RESTRICTIONS: No  FALLS:  Has patient fallen in last 6 months? No  LIVING ENVIRONMENT: Lives in: House/apartment Stairs: bedroom on second floor   OCCUPATION: Retired   PLOF: Independent, Rt hand dominant, housework activity, yard activity, previously went to gym (bike, [employer] for whole body)  PATIENT GOALS: Reduce pain, would like to return to gym  OBJECTIVE:   DIAGNOSTIC FINDINGS:  12/29/2021 review:  IMPRESSION:  1. There is a possible area of endometrial thickening. Correlate with pelvic ultrasound.  2. Moderate spinal stenosis at L2-L3 due to disc bulge and facet arthropathy  3. Moderate to severe spinal stenosis at L3-L4 due to disc bulge and facet arthropathy  4. Mild degenerative disc disease at L4-L5 with disc bulge that extends more to the right. There is facet arthropathy with facet enlargement and moderate stenosis of the right lateral recess.   PATIENT SURVEYS:  01/28/2022:  FOTO  update: 46  12/29/2021 FOTO eval:  47  predicted:  61  SCREENING FOR RED FLAGS: 12/29/2021 Bowel or bladder incontinence: No Cauda equina syndrome: No  COGNITION: 12/29/2021 Overall cognitive status: WFL normal      SENSATION: 12/29/2021 Not checked today  MUSCLE LENGTH: 12/29/2021 No specific testing  POSTURE:  12/29/2021 Mild FHP, rounded shoulders.  Mild reduced lumbar lordosis in standing.   PALPATION: 12/29/2021 Trigger points c concordant shoulder/UE symptoms from Lt infraspinatus.   CERVICAL ROM:   ROM AROM (deg) 12/29/2021 02/09/2022  Flexion 50 c no change 65  Extension 65 c no change 63  Right lateral flexion    Left lateral flexion    Right rotation 79 c pulling on Lt upper trap region/shoulder 66  Left rotation 75 c no change 80   (Blank rows = not tested)   LUMBAR ROM:   AROM 12/29/2021 01/28/2022  Flexion To mid shin, pulling in back and both legs, painful arc upon return To ankles with Rt knee pain   Extension 75 % WFL 75 % WFL lower back end range pain  Repeated x 5 in standing:  improved to 100 % c similar symptoms  Right lateral flexion To knee joint, pulling on Lt lumbar   Left lateral flexion To knee joint, pulling Rt lumbar   Right rotation    Left rotation     (Blank rows = not tested)  ROM:      Right 12/29/2021 Left 12/29/2021  Hip flexion    Hip extension    Hip abduction    Hip adduction    Hip internal rotation    Hip external rotation        Shoulder flexion [facility]  Shoulder Abduction    Shoulder ER    Shoulder IR         (Blank rows = not tested)  MMT:    MMT Right 12/29/2021 Left 12/29/2021 Right 01/28/2022 Left 02/09/2022  Hip flexion   5/5   Hip extension      Hip abduction   4/5   Hip  adduction      Hip internal rotation      Hip external rotation      Knee flexion   5/5         Knee extension   5/5   Ankle dorsiflexion            Shoulder flexion 5/5 5/5 c pain Lt shoulder  4/5 c shoulder pain   Shoulder abduction 5/5 5/5 c pain Lt shoulder  5/5 c shoulder pain  Shoulder ER 5/5 4/5 c pain Lt shoulder  5/5   Shoulder IR 5/5 5/5  5/5   (Blank rows = not tested)  SPECIAL TESTS:  12/29/2021 (-) Cervical spurling's compression bilaterally   FUNCTIONAL TESTS:  12/29/2021 None performed  GAIT: 12/29/2021 Independent ambulation  TODAY'S TREATMENT:                                                                                                                 DATE: 02/09/2022  Mechanical Traction Intermittent 15/10 lbs 60/20 seconds 10 mins  Therex: Verbal review of HEP for continued use.  Education given throughout about importance of using HEP to help manage symptoms in short term as well as building movement capacity for future improvement.    TODAY'S TREATMENT:                                                                                                              DATE: 01/28/2022  Therex: Nustep Lvl 6 10 mins UE/LE Standing lumbar extension AROM x 5 Supine SLR Rt x 5, Lt x 2 Supine bridge x 10 (stopped due to pain complaints) Supine Thomas stretch for Rt hip flexors/quad 15 sec x 5 (added to HEP) Seated SLR x 10 bilateral (added to HEP) Sit to stand to sit 18 inch table height x 5  Additional time spent in review of existing HEP and review of additions for today.      TODAY'S TREATMENT:  DATE: 01/04/2022  Therex: Cross arm stretch Lt shoulder seated c moist heat on Lt shoulder 15 sec x 5 Standing green band rows c scapular retraction 2 x 10 c moist heat on Lt shoulder Standing green band ER 2 x 10 c towel under arm  Standing lumbar extension AROM x 5 Sit to stand to sit transfer s UE assist 18 inch chair x 10 c slow lowering focus - 21 inch table height  Manual Compression to Lt infraspinatus  Trigger Point Dry-Needling  Treatment instructions:  Expect mild to moderate muscle soreness. S/S of pneumothorax if dry needled over a lung field, and to seek immediate medical attention should they occur. Patient verbalized understanding of these instructions and education.  Patient Consent Given: Yes Education handout provided: Previously provided Muscles treated: Lt infraspinatus Treatment response/outcome: twitch response c concordant symptoms noted, no adverse reaction     PATIENT EDUCATION:  01/28/2022 Education details: HEP update Person educated: Patient Education method: Consulting civil engineer, Demonstration, Verbal cues, and Handouts Education comprehension: verbalized understanding, returned demonstration, and verbal cues required  HOME EXERCISE PROGRAM: Access Code: [email or web address] URL: [email or web address] Date: 01/28/2022 Prepared by: Scot Jun  Exercises - Supine Lower Trunk Rotation  - 2-3 x daily - 7 x weekly - 1 sets - 3-5 reps - 15 hold - Standing Lumbar Extension with Counter  - 3-5 x daily - 7 x weekly - 1 sets - 5-10 reps - Hip Flexor Stretch at Edge of Bed  - 1-2 x daily - 7 x weekly - 1 sets - 5 reps - 15 hold - Seated Scapular Retraction  - 3-5 x daily - 7 x weekly - 1 sets - 5-10 reps - 3-5 hold - Standing Shoulder Posterior Capsule Stretch  - 2-3 x daily - 7 x weekly - 1 sets - 5 reps - 15 hold - Shoulder External Rotation with Anchored Resistance  - 1-2 x daily - 7 x weekly - 1-2 sets - 10 reps - Standing Shoulder Row with Anchored Resistance  - 1-2 x daily - 7 x weekly - 1-2 sets - 10-15 reps - Sit to Stand  - 3 x daily - 7 x weekly - 1 sets - 10 reps - Clamshell  - 1-2 x daily - 7 x weekly - 3 sets - 10 reps - Seated Straight Leg Heel Taps  - 1-2 x daily - 7 x weekly - 3 sets - 10 reps  ASSESSMENT:  CLINICAL IMPRESSION: Due to symptoms being reported today, additional time was spent in reassessment of movement patterns and discussion of presentation and POC adjustment for future to meet Pt goals.   Pt has attended 4 visits overall during course of treatment cycle dating back to 12/2021.  Short term relief noted at times but patient reported Global rating of change +0 about the same in comparison to original pain levels.  Continued elevated symptoms related to LUE as well as back and RLE that impact her daily life ability.  At this time, Pt has demonstrated some mild objective changes to this point but continued to show functional deficits.  At this time, clinician and Pt were in agreement with plan to continue HEP and treatment as she desired but to set up a return to MD visit for additional follow up options.    OBJECTIVE IMPAIRMENTS: decreased activity tolerance, decreased coordination, decreased endurance, decreased mobility, decreased ROM, decreased strength, increased fascial restrictions, impaired perceived functional ability, increased muscle spasms, impaired flexibility, impaired UE functional use, improper body mechanics,  and pain.   ACTIVITY LIMITATIONS: carrying, lifting, bending, sitting, standing, squatting, sleeping, transfers, and reach over head  PARTICIPATION LIMITATIONS: meal prep, cleaning, laundry, interpersonal relationship, shopping, community activity, and yard work  PERSONAL FACTORS: Time since onset of injury/illness/exacerbation and 3+ comorbidities: DM, HTN, multiple involved body parts  are also affecting patient's functional outcome.   REHAB POTENTIAL:Good  CLINICAL DECISION MAKING: Evolving/moderate complexity  EVALUATION COMPLEXITY: Moderate   GOALS: Goals reviewed with patient? Yes  SHORT TERM GOALS: (target date for Short term goals are 3 weeks 01/19/2022)  1. Patient will demonstrate independent use of home exercise program to maintain progress from in clinic treatments.  Goal status: Met  LONG TERM GOALS: (target dates for all long term goals are 10 weeks  03/09/2022 )   1. Patient will demonstrate/report pain at worst less than or equal to 2/10 to  facilitate minimal limitation in daily activity secondary to pain symptoms.  Goal status: on going 02/09/2022   2. Patient will demonstrate independent use of home exercise program to facilitate ability to maintain/progress functional gains from skilled physical therapy services.  Goal status: on going 02/09/2022   3. Patient will demonstrate FOTO outcome > or = 61 % to indicate reduced disability due to condition.  Goal status: on going 02/09/2022   4. Patient will demonstrate lumbar extension 100 % WFL s symptoms to facilitate upright standing, walking posture at PLOF s limitation.  Goal status: on going 02/09/2022   5.  Patient will demonstrate Lt shoulder MMT 5/5 throughout s symptoms to facilitate usual arm movement, lifting at PLOF s limitation.   Goal status: on going 02/09/2022   6.  Patient will demonstrate/report ability to sleep s restriction due to symptoms.  Goal status: on going 02/09/2022   7.  Patient will demonstrate/report ability to perform house/yard activity at PLOF s restriction due to symptoms.  Goal Status: on going 02/09/2022  PLAN:  PT FREQUENCY: 1-2x/week  PT DURATION: 10 weeks  PLANNED INTERVENTIONS: Therapeutic exercises, Therapeutic activity, Neuro Muscular re-education, Balance training, Gait training, Patient/Family education, Joint mobilization, Stair training, DME instructions, Dry Needling, Electrical stimulation, Cryotherapy, vasopneumatic device, Moist heat, Taping, Traction Ultrasound, Ionotophoresis '4mg'$ /ml Dexamethasone, and Manual therapy.  All included unless contraindicated  PLAN FOR NEXT SESSION: May return to clinic if desired, continue plan for improved symptoms, strength and mobility as tolerated.  Follow up on any return to MD visit.    Scot Jun, PT, DPT, OCS, ATC 02/09/22  10:21 AM   PHYSICAL THERAPY DISCHARGE SUMMARY  Visits from Start of Care: 4  Current functional level related to goals / functional outcomes: See note    Remaining deficits: See note   Education / Equipment: HEP  Patient goals were partially met. Patient is being discharged due to not returning since the last visit.  Scot Jun, PT, DPT, OCS, ATC 03/11/22  1:14 PM

## 2022-02-12 ENCOUNTER — Ambulatory Visit: Payer: Medicare Other | Admitting: Internal Medicine

## 2022-02-16 ENCOUNTER — Encounter: Payer: Medicare Other | Admitting: Rehabilitative and Restorative Service Providers"

## 2022-02-23 ENCOUNTER — Ambulatory Visit (INDEPENDENT_AMBULATORY_CARE_PROVIDER_SITE_OTHER): Payer: Medicare Other

## 2022-02-23 ENCOUNTER — Ambulatory Visit (INDEPENDENT_AMBULATORY_CARE_PROVIDER_SITE_OTHER): Payer: Medicare Other | Admitting: Orthopaedic Surgery

## 2022-02-23 ENCOUNTER — Encounter: Payer: Self-pay | Admitting: Orthopaedic Surgery

## 2022-02-23 VITALS — BP 173/93 | HR 62 | Ht 65.0 in | Wt 180.0 lb

## 2022-02-23 DIAGNOSIS — R202 Paresthesia of skin: Secondary | ICD-10-CM | POA: Diagnosis not present

## 2022-02-23 DIAGNOSIS — R200 Anesthesia of skin: Secondary | ICD-10-CM

## 2022-02-23 DIAGNOSIS — M79602 Pain in left arm: Secondary | ICD-10-CM

## 2022-02-23 NOTE — Progress Notes (Signed)
Office Visit Note   Patient: Kimberly Combs           Date of Birth: Mar 05, 1955           MRN: [id number] Visit Date: 02/23/2022              Requested by: Colon Branch, [address] [place],  [place] [address] PCP: Colon Branch, MD   Assessment & Plan: Visit Diagnoses:  1. Left arm pain     Plan: Will place in a wrist splint she can wear at night hopefully give her relief of her symptoms to continue to wake her up.  Will set up for EMGs nerve conduction velocity for evaluation of carpal tunnel syndrome worse on the left than right hand.  Office follow-up after test for review.  Pathophysiology discussed.  Follow-Up Instructions: No follow-ups on file.   Orders:  Orders Placed This Encounter  Procedures   XR Cervical Spine 2 or 3 views   No orders of the defined types were placed in this encounter.     Procedures: No procedures performed   Clinical Data: No additional findings.   Subjective: Chief Complaint  Patient presents with   Left Arm - Pain, Follow-up   Lower Back - Follow-up    HPI 67 year old female returns she is having ongoing problems with numbness in her hands left much worse than right it wakes her up at night.  Repetitively she wakes up asked to shake her hands before she is able to go back to sleep.  She is used lidocaine spray, Biofreeze, ibuprofen, Tylenol, Flexeril without relief.  She denies any significant neck pain and states she can move her neck comfortably.  She is able to get her left arm up overhead.  Review of Systems positive for diabetes on metformin.   Objective: Vital Signs: BP (!) 173/93   Pulse 62   Ht '5\' 5"'$  (1.651 m)   Wt 180 lb (81.6 kg)   BMI 29.95 kg/m   Physical Exam Constitutional:      Appearance: She is well-developed.  HENT:     Head: Normocephalic.     Right Ear: External ear normal.     Left Ear: External ear normal. There is no impacted cerumen.  Eyes:     Pupils: Pupils are equal, round, and  reactive to light.  Neck:     Thyroid: No thyromegaly.     Trachea: No tracheal deviation.  Cardiovascular:     Rate and Rhythm: Normal rate.  Pulmonary:     Effort: Pulmonary effort is normal.  Abdominal:     Palpations: Abdomen is soft.  Musculoskeletal:     Cervical back: No rigidity.  Skin:    General: Skin is warm and dry.  Neurological:     Mental Status: She is alert and oriented to person, place, and time.  Psychiatric:        Behavior: Behavior normal.     Ortho Exam no brachial plexus tenderness negative Spurling.  Negative Phalen's test mild discomfort with carpal compression on the left hand.  No thenar atrophy.  Normal sensation to the small finger.  Interossei are strong.  Good flexion fingertips to distal palmar crease.  Specialty Comments:  No specialty comments available.  Imaging: XR Cervical Spine 2 or 3 views  Result Date: 02/23/2022 AP lateral cervical spine images are obtained and reviewed.  Normal cervical curvature.  Trace narrowing at C5-6 and slight anterior longitudinal ligament  calcification C4-5.  No significant facet degenerative changes on AP images. Impression: Minimal mid cervical radiographic changes.    PMFS History: Patient Active Problem List   Diagnosis Date Noted   Dyslipidemia 08/12/2021   Spinal stenosis of lumbar region 06/08/2019   Annual physical exam 12/07/2018   Insomnia 12/01/2017   RLS (restless legs syndrome) 11/03/2017   Anxiety 11/03/2017   Colon polyps 11/03/2017   PCP NOTES >>>>>>>>>>>>>>>>>>>>>> 06/01/2017   Diabetes mellitus ([place])    Hypertension    Thyroid disease    Past Medical History:  Diagnosis Date   Diabetes mellitus ([place])    Hypertension    Thyroid disease     Family History  Problem Relation Age of Onset   Arthritis Mother    Diabetes Father    CAD Neg Hx    Colon cancer Neg Hx    Breast cancer Neg Hx    Esophageal cancer Neg Hx    Stomach cancer Neg Hx    Rectal cancer Neg Hx     Past  Surgical History:  Procedure Laterality Date   CESAREAN SECTION  5945,8592   X2   COLONOSCOPY  10 years ago    in [place]="normal exam" per pt   Social History   Occupational History   Occupation: retired - Education officer, museum , [place]  Tobacco Use   Smoking status: Former    Types: Cigarettes    Quit date: 2000    Years since quitting: 24.0   Smokeless tobacco: Never   Tobacco comments:    light smoker, quit ~ 2000  Vaping Use   Vaping Use: Never used  Substance and Sexual Activity   Alcohol use: No   Drug use: Never   Sexual activity: Not on file

## 2022-02-23 NOTE — Addendum Note (Signed)
Addended by: Meyer Cory on: 02/23/2022 09:54 AM   Modules accepted: Orders

## 2022-02-25 ENCOUNTER — Ambulatory Visit: Payer: Medicare Other | Admitting: Internal Medicine

## 2022-03-01 ENCOUNTER — Telehealth: Payer: Self-pay | Admitting: Physical Medicine and Rehabilitation

## 2022-03-01 NOTE — Telephone Encounter (Signed)
Pt returned call to Dr Ernestina Patches office to set an appt. Pt phone number is [phone].

## 2022-03-02 ENCOUNTER — Telehealth: Payer: Self-pay | Admitting: Physical Medicine and Rehabilitation

## 2022-03-02 NOTE — Telephone Encounter (Signed)
Spoke with patient and scheduled NCV for 03/09/22

## 2022-03-02 NOTE — Telephone Encounter (Signed)
Patient wants to make an appointment

## 2022-03-09 ENCOUNTER — Encounter: Payer: Self-pay | Admitting: Physical Medicine and Rehabilitation

## 2022-03-09 ENCOUNTER — Ambulatory Visit (INDEPENDENT_AMBULATORY_CARE_PROVIDER_SITE_OTHER): Payer: Medicare Other | Admitting: Physical Medicine and Rehabilitation

## 2022-03-09 DIAGNOSIS — R202 Paresthesia of skin: Secondary | ICD-10-CM

## 2022-03-09 DIAGNOSIS — M79602 Pain in left arm: Secondary | ICD-10-CM | POA: Diagnosis not present

## 2022-03-09 DIAGNOSIS — M7542 Impingement syndrome of left shoulder: Secondary | ICD-10-CM | POA: Diagnosis not present

## 2022-03-09 NOTE — Progress Notes (Signed)
Functional Pain Scale - descriptive words and definitions  Distracting (5)    Aware of pain/able to complete some ADL's but limited by pain/sleep is affected and active distractions are only slightly useful. Moderate range order  Average Pain 5   +Driver, -BT, -Dye Allergies.   Left hand numbness for months. Says no problem with right

## 2022-03-15 NOTE — Procedures (Unsigned)
EMG & NCV Findings: Evaluation of the left median motor and the right median motor nerves showed prolonged distal onset latency (L6.0, R5.0 ms) and decreased conduction velocity (Elbow-Wrist, L46, R44 m/s).  The left median (across palm) sensory nerve showed prolonged distal peak latency (Wrist, 6.6 ms), reduced amplitude (8.2 V), and prolonged distal peak latency (Palm, 3.8 ms).  The right median (across palm) sensory nerve showed prolonged distal peak latency (Wrist, 5.0 ms).  The left ulnar sensory nerve showed reduced amplitude (13.7 V).  All remaining nerves (as indicated in the following tables) were within normal limits.  Left vs. Right side comparison data for the median motor nerve indicates abnormal L-R latency difference (1.0 ms).    All examined muscles (as indicated in the following table) showed no evidence of electrical instability.    Impression: The above electrodiagnostic study is ABNORMAL and reveals evidence of:  a moderate to severe left median nerve entrapment at the wrist (carpal tunnel syndrome) affecting sensory and motor components.   a moderate right median nerve entrapment at the wrist (carpal tunnel syndrome) affecting sensory and motor components.   There is no significant electrodiagnostic evidence of any other focal nerve entrapment, brachial plexopathy or cervical radiculopathy.   Recommendations: 1.  Follow-up with referring physician. 2.  Continue current management of symptoms. 3.  Suggest surgical evaluation.  ___________________________ Laurence Spates FAAPMR Board Certified, American Board of Physical Medicine and Rehabilitation    Nerve Conduction Studies Anti Sensory Summary Table   Stim Site NR Peak (ms) Norm Peak (ms) P-T Amp (V) Norm P-T Amp Site1 Site2 Delta-P (ms) Dist (cm) Vel (m/s) Norm Vel (m/s)  Left Median Acr Palm Anti Sensory (2nd Digit)  28.9C  Wrist    *6.6 <3.6 *8.2 >10 Wrist Palm 2.8 0.0    Palm    *3.8 <2.0 2.9         Right  Median Acr Palm Anti Sensory (2nd Digit)  29.5C  Wrist    *5.0 <3.6 20.5 >10 Wrist Palm 3.0 0.0    Palm    2.0 <2.0 4.6         Left Radial Anti Sensory (Base 1st Digit)  30.6C  Wrist    2.4 <3.1 45.7  Wrist Base 1st Digit 2.4 0.0    Left Ulnar Anti Sensory (5th Digit)  31.3C  Wrist    3.5 <3.7 *13.7 >15.0 Wrist 5th Digit 3.5 14.0 40 >38   Motor Summary Table   Stim Site NR Onset (ms) Norm Onset (ms) O-P Amp (mV) Norm O-P Amp Site1 Site2 Delta-0 (ms) Dist (cm) Vel (m/s) Norm Vel (m/s)  Left Median Motor (Abd Poll Brev)  30.2C  Wrist    *6.0 <4.2 8.3 >5 Elbow Wrist 4.8 22.0 *46 >50  Elbow    10.8  7.5         Right Median Motor (Abd Poll Brev)  29.8C  Wrist    *5.0 <4.2 6.6 >5 Elbow Wrist 4.8 21.0 *44 >50  Elbow    9.8  5.5         Left Ulnar Motor (Abd Dig Min)  30.2C  Wrist    3.7 <4.2 7.5 >3 B Elbow Wrist 3.7 19.5 53 >53  B Elbow    7.4  10.2  A Elbow B Elbow 1.6 10.0 63 >53  A Elbow    9.0  10.0          EMG   Side Muscle Nerve Root Ins Act Fibs Psw Amp  Dur Poly Recrt Int Fraser Din Comment  Left Abd Poll Brev Median C8-T1 Nml Nml Nml Nml Nml 0 Nml Nml   Left 1stDorInt Ulnar C8-T1 Nml Nml Nml Nml Nml 0 Nml Nml   Left PronatorTeres Median C6-7 Nml Nml Nml Nml Nml 0 Nml Nml   Left Biceps Musculocut C5-6 Nml Nml Nml Nml Nml 0 Nml Nml   Left Deltoid Axillary C5-6 Nml Nml Nml Nml Nml 0 Nml Nml     Nerve Conduction Studies Anti Sensory Left/Right Comparison   Stim Site L Lat (ms) R Lat (ms) L-R Lat (ms) L Amp (V) R Amp (V) L-R Amp (%) Site1 Site2 L Vel (m/s) R Vel (m/s) L-R Vel (m/s)  Median Acr Palm Anti Sensory (2nd Digit)  28.9C  Wrist *6.6 *5.0 1.6 *8.2 20.5 60.0 Wrist Palm     Palm *3.8 2.0 1.8 2.9 4.6 37.0       Radial Anti Sensory (Base 1st Digit)  30.6C  Wrist 2.4   45.7   Wrist Base 1st Digit     Ulnar Anti Sensory (5th Digit)  31.3C  Wrist 3.5   *13.7   Wrist 5th Digit 40     Motor Left/Right Comparison   Stim Site L Lat (ms) R Lat (ms) L-R Lat (ms) L Amp (mV)  R Amp (mV) L-R Amp (%) Site1 Site2 L Vel (m/s) R Vel (m/s) L-R Vel (m/s)  Median Motor (Abd Poll Brev)  30.2C  Wrist *6.0 *5.0 *1.0 8.3 6.6 20.5 Elbow Wrist *46 *44 2  Elbow 10.8 9.8 1.0 7.5 5.5 26.7       Ulnar Motor (Abd Dig Min)  30.2C  Wrist 3.7   7.5   B Elbow Wrist 53    B Elbow 7.4   10.2   A Elbow B Elbow 63    A Elbow 9.0   10.0            Waveforms:

## 2022-03-16 NOTE — Progress Notes (Signed)
Kimberly Combs - 67 y.o. female MRN [id number]  Date of birth: Jul 11, 1955  Office Visit Note: Visit Date: 03/09/2022 PCP: Colon Branch, MD Referred by: Marybelle Killings, MD  Subjective: Chief Complaint  Patient presents with   Left Hand - Numbness   HPI: Kimberly Combs is a 67 y.o. female who comes in today at the request of Dr. Rodell Perna for evaluation and management of chronic, worsening and severe pain, numbness and tingling in the Bilateral upper extremities.  Patient is Right hand dominant.  She reports chronic worsening severe left much worse than right pain numbness and tingling in the hands somewhat nondermatomal may be somewhat more radial digits.  She has had a history of shoulder impingement that seems to be somewhat improved after physical therapy.  She has had chronic back pain and has had physical therapy for her low back.  No real neck pain.  No real symptoms down the arms.  Her case is complicated by diabetes with hemoglobin A1c in the 7.0 range.  She also has a history of thyroid disease which can affect the nerves.  No history of polyneuropathy.  She has tried Flexeril and anti-inflammatories and other medications and Biofreeze without any relief.  She has had wrist splints recently over the last couple weeks given to her by Dr. Lorin Mercy.  She seems like that has helped to some degree.  She has felt weakness at times but no focal weakness.  She does have a positive flick sign with nocturnal complaints.     Review of Systems  Musculoskeletal:  Positive for joint pain.  Neurological:  Positive for tingling and weakness.  All other systems reviewed and are negative.  Otherwise per HPI.  Assessment & Plan: Visit Diagnoses:    ICD-10-CM   1. Paresthesia of skin  R20.2 NCV with EMG (electromyography)    2. Left arm pain  M79.602     3. Impingement syndrome of left shoulder  M75.42        Plan: Impression: Clinically seems like she has a combination of median neuropathy and  arthritic changes in the hand with a questionable underlying radiculitis.  Radiculitis is less likely given the clinical story although she has had some issues with her low back.  She does have diabetes with hemoglobin A1c above 7.  Symptoms do not seem to be a polyneuropathy.  Electrodiagnostic study was performed.  The above electrodiagnostic study is ABNORMAL and reveals evidence of:  a moderate to severe left median nerve entrapment at the wrist (carpal tunnel syndrome) affecting sensory and motor components.   a moderate right median nerve entrapment at the wrist (carpal tunnel syndrome) affecting sensory and motor components.   There is no significant electrodiagnostic evidence of any other focal nerve entrapment, brachial plexopathy or cervical radiculopathy.  **As you know, this particular electrodiagnostic study cannot rule out chemical radiculitis or sensory only radiculopathy.  No signs indicative of a polyneuropathy at least in the upper extremities.  If felt to be relevant could refer to neurology for further workup and possible lower limb study.  Recommendations: 1.  Follow-up with referring physician. 2.  Continue current management of symptoms.  Continue with bracing although median neuropathy likely bad enough that the bracing not going to reverse its course. 3.  Suggest surgical evaluation.  If felt to be radicular at all could look at cervical MRI  Meds & Orders: No orders of the defined types were placed in this encounter.   Orders Placed  This Encounter  Procedures   NCV with EMG (electromyography)    Follow-up: Return for Rodell Perna, MD.   Procedures: No procedures performed  EMG & NCV Findings: Evaluation of the left median motor and the right median motor nerves showed prolonged distal onset latency (L6.0, R5.0 ms) and decreased conduction velocity (Elbow-Wrist, L46, R44 m/s).  The left median (across palm) sensory nerve showed prolonged distal peak latency (Wrist,  6.6 ms), reduced amplitude (8.2 V), and prolonged distal peak latency (Palm, 3.8 ms).  The right median (across palm) sensory nerve showed prolonged distal peak latency (Wrist, 5.0 ms).  The left ulnar sensory nerve showed reduced amplitude (13.7 V).  All remaining nerves (as indicated in the following tables) were within normal limits.  Left vs. Right side comparison data for the median motor nerve indicates abnormal L-R latency difference (1.0 ms).    All examined muscles (as indicated in the following table) showed no evidence of electrical instability.    Impression: The above electrodiagnostic study is ABNORMAL and reveals evidence of:  a moderate to severe left median nerve entrapment at the wrist (carpal tunnel syndrome) affecting sensory and motor components.   a moderate right median nerve entrapment at the wrist (carpal tunnel syndrome) affecting sensory and motor components.   There is no significant electrodiagnostic evidence of any other focal nerve entrapment, brachial plexopathy or cervical radiculopathy.   Recommendations: 1.  Follow-up with referring physician. 2.  Continue current management of symptoms. 3.  Suggest surgical evaluation.  ___________________________ Laurence Spates FAAPMR Board Certified, American Board of Physical Medicine and Rehabilitation    Nerve Conduction Studies Anti Sensory Summary Table   Stim Site NR Peak (ms) Norm Peak (ms) P-T Amp (V) Norm P-T Amp Site1 Site2 Delta-P (ms) Dist (cm) Vel (m/s) Norm Vel (m/s)  Left Median Acr Palm Anti Sensory (2nd Digit)  28.9C  Wrist    *6.6 <3.6 *8.2 >10 Wrist Palm 2.8 0.0    Palm    *3.8 <2.0 2.9         Right Median Acr Palm Anti Sensory (2nd Digit)  29.5C  Wrist    *5.0 <3.6 20.5 >10 Wrist Palm 3.0 0.0    Palm    2.0 <2.0 4.6         Left Radial Anti Sensory (Base 1st Digit)  30.6C  Wrist    2.4 <3.1 45.7  Wrist Base 1st Digit 2.4 0.0    Left Ulnar Anti Sensory (5th Digit)  31.3C  Wrist    3.5  <3.7 *13.7 >15.0 Wrist 5th Digit 3.5 14.0 40 >38   Motor Summary Table   Stim Site NR Onset (ms) Norm Onset (ms) O-P Amp (mV) Norm O-P Amp Site1 Site2 Delta-0 (ms) Dist (cm) Vel (m/s) Norm Vel (m/s)  Left Median Motor (Abd Poll Brev)  30.2C  Wrist    *6.0 <4.2 8.3 >5 Elbow Wrist 4.8 22.0 *46 >50  Elbow    10.8  7.5         Right Median Motor (Abd Poll Brev)  29.8C  Wrist    *5.0 <4.2 6.6 >5 Elbow Wrist 4.8 21.0 *44 >50  Elbow    9.8  5.5         Left Ulnar Motor (Abd Dig Min)  30.2C  Wrist    3.7 <4.2 7.5 >3 B Elbow Wrist 3.7 19.5 53 >53  B Elbow    7.4  10.2  A Elbow B Elbow 1.6 10.0 63 >53  A Elbow  9.0  10.0          EMG   Side Muscle Nerve Root Ins Act Fibs Psw Amp Dur Poly Recrt Int Fraser Din Comment  Left Abd Poll Brev Median C8-T1 Nml Nml Nml Nml Nml 0 Nml Nml   Left 1stDorInt Ulnar C8-T1 Nml Nml Nml Nml Nml 0 Nml Nml   Left PronatorTeres Median C6-7 Nml Nml Nml Nml Nml 0 Nml Nml   Left Biceps Musculocut C5-6 Nml Nml Nml Nml Nml 0 Nml Nml   Left Deltoid Axillary C5-6 Nml Nml Nml Nml Nml 0 Nml Nml     Nerve Conduction Studies Anti Sensory Left/Right Comparison   Stim Site L Lat (ms) R Lat (ms) L-R Lat (ms) L Amp (V) R Amp (V) L-R Amp (%) Site1 Site2 L Vel (m/s) R Vel (m/s) L-R Vel (m/s)  Median Acr Palm Anti Sensory (2nd Digit)  28.9C  Wrist *6.6 *5.0 1.6 *8.2 20.5 60.0 Wrist Palm     Palm *3.8 2.0 1.8 2.9 4.6 37.0       Radial Anti Sensory (Base 1st Digit)  30.6C  Wrist 2.4   45.7   Wrist Base 1st Digit     Ulnar Anti Sensory (5th Digit)  31.3C  Wrist 3.5   *13.7   Wrist 5th Digit 40     Motor Left/Right Comparison   Stim Site L Lat (ms) R Lat (ms) L-R Lat (ms) L Amp (mV) R Amp (mV) L-R Amp (%) Site1 Site2 L Vel (m/s) R Vel (m/s) L-R Vel (m/s)  Median Motor (Abd Poll Brev)  30.2C  Wrist *6.0 *5.0 *1.0 8.3 6.6 20.5 Elbow Wrist *46 *44 2  Elbow 10.8 9.8 1.0 7.5 5.5 26.7       Ulnar Motor (Abd Dig Min)  30.2C  Wrist 3.7   7.5   B Elbow Wrist 53    B Elbow 7.4    10.2   A Elbow B Elbow 63    A Elbow 9.0   10.0            Waveforms:               Clinical History: 02/23/2022 Cspine AP lateral cervical spine images are obtained and reviewed.  Normal  cervical curvature.  Trace narrowing at C5-6 and slight anterior  longitudinal ligament calcification C4-5.  No significant facet  degenerative changes on AP images.   Impression: Minimal mid cervical radiographic changes.  Rodell Perna, MD   She reports that she quit smoking about 24 years ago. Her smoking use included cigarettes. She has never used smokeless tobacco.  Recent Labs    08/12/21 1046 11/26/21 1016  HGBA1C 7.2* 7.0*    Objective:  VS:  HT:    WT:   BMI:     BP:   HR: bpm  TEMP: ( )  RESP:  Physical Exam Vitals and nursing note reviewed.  Constitutional:      General: She is not in acute distress.    Appearance: Normal appearance. She is well-developed. She is not ill-appearing.  HENT:     Head: Normocephalic and atraumatic.  Eyes:     Conjunctiva/sclera: Conjunctivae normal.     Pupils: Pupils are equal, round, and reactive to light.  Cardiovascular:     Rate and Rhythm: Normal rate.     Pulses: Normal pulses.  Pulmonary:     Effort: Pulmonary effort is normal.  Musculoskeletal:        General: No swelling, tenderness  or deformity.     Right lower leg: No edema.     Left lower leg: No edema.     Comments: Inspection reveals no atrophy of the bilateral APB or FDI or hand intrinsics. There is no swelling, color changes, allodynia or dystrophic changes. There is 5 out of 5 strength in the bilateral wrist extension, finger abduction and long finger flexion. There is intact sensation to light touch in all dermatomal and peripheral nerve distributions. There is a negative Froment's test bilaterally. There is a negative Tinel's test at the bilateral wrist and elbow. There is a positive Phalen's test bilaterally. There is a negative Hoffmann's test bilaterally.  Skin:     General: Skin is warm and dry.     Findings: No erythema or rash.  Neurological:     General: No focal deficit present.     Mental Status: She is alert and oriented to person, place, and time.     Sensory: No sensory deficit.     Motor: No weakness or abnormal muscle tone.     Coordination: Coordination normal.     Gait: Gait normal.  Psychiatric:        Mood and Affect: Mood normal.        Behavior: Behavior normal.     Ortho Exam  Imaging: No results found.  Past Medical/Family/Surgical/Social History: Medications & Allergies reviewed per EMR, new medications updated. Patient Active Problem List   Diagnosis Date Noted   Numbness and tingling in left hand 02/23/2022   Dyslipidemia 08/12/2021   Spinal stenosis of lumbar region 06/08/2019   Annual physical exam 12/07/2018   Insomnia 12/01/2017   RLS (restless legs syndrome) 11/03/2017   Anxiety 11/03/2017   Colon polyps 11/03/2017   PCP NOTES >>>>>>>>>>>>>>>>>>>>>> 06/01/2017   Diabetes mellitus ([place])    Hypertension    Thyroid disease    Past Medical History:  Diagnosis Date   Diabetes mellitus ([place])    Hypertension    Thyroid disease    Family History  Problem Relation Age of Onset   Arthritis Mother    Diabetes Father    CAD Neg Hx    Colon cancer Neg Hx    Breast cancer Neg Hx    Esophageal cancer Neg Hx    Stomach cancer Neg Hx    Rectal cancer Neg Hx    Past Surgical History:  Procedure Laterality Date   CESAREAN SECTION  [id number]   X2   COLONOSCOPY  10 years ago    in [place]="normal exam" per pt   Social History   Occupational History   Occupation: retired - Education officer, museum , [place]  Tobacco Use   Smoking status: Former    Types: Cigarettes    Quit date: 2000    Years since quitting: 24.1   Smokeless tobacco: Never   Tobacco comments:    light smoker, quit ~ 2000  Vaping Use   Vaping Use: Never used  Substance and Sexual Activity   Alcohol use: No   Drug use: Never   Sexual activity:  Not on file

## 2022-03-17 ENCOUNTER — Encounter: Payer: Self-pay | Admitting: Internal Medicine

## 2022-03-24 ENCOUNTER — Ambulatory Visit (INDEPENDENT_AMBULATORY_CARE_PROVIDER_SITE_OTHER): Payer: Medicare Other | Admitting: Orthopaedic Surgery

## 2022-03-24 ENCOUNTER — Encounter: Payer: Self-pay | Admitting: Orthopaedic Surgery

## 2022-03-24 VITALS — BP 135/76 | HR 69 | Ht 65.0 in | Wt 180.0 lb

## 2022-03-24 DIAGNOSIS — G5602 Carpal tunnel syndrome, left upper limb: Secondary | ICD-10-CM | POA: Diagnosis not present

## 2022-03-24 DIAGNOSIS — G5601 Carpal tunnel syndrome, right upper limb: Secondary | ICD-10-CM

## 2022-03-24 MED ORDER — LIDOCAINE HCL 1 % IJ SOLN
0.5000 mL | INTRAMUSCULAR | Status: AC | PRN
  Administered 2022-03-24: .5 mL

## 2022-03-24 MED ORDER — BUPIVACAINE HCL 0.25 % IJ SOLN
1.0000 mL | INTRAMUSCULAR | Status: AC | PRN
  Administered 2022-03-24: 1 mL

## 2022-03-24 MED ORDER — METHYLPREDNISOLONE ACETATE 40 MG/ML IJ SUSP
20.0000 mg | INTRAMUSCULAR | Status: AC | PRN
  Administered 2022-03-24: 20 mg

## 2022-03-24 NOTE — Progress Notes (Signed)
Office Visit Note   Patient: Kimberly Combs           Date of Birth: 01-30-1956           MRN: [id number] Visit Date: 03/24/2022              Requested by: Colon Branch, [address] [place],  [place] [address] PCP: Colon Branch, MD   Assessment & Plan: Visit Diagnoses:  1. Carpal tunnel syndrome, left upper limb     Plan: Carpal tunnel injection performed.  She has persistent problems she will call about carpal tunnel release.  She will continue use her splint.  Pathophysiology discussed.  Treatment options discussed and reviewed.  Follow-Up Instructions: No follow-ups on file.   Orders:  Orders Placed This Encounter  Procedures   Hand/UE Inj   No orders of the defined types were placed in this encounter.     Procedures: Hand/UE Inj for carpal tunnel syndrome on 03/24/2022 9:42 AM Medications: 0.5 mL lidocaine 1 %; 1 mL bupivacaine 0.25 %; 20 mg methylPREDNISolone acetate 40 MG/ML      Clinical Data: No additional findings.   Subjective: Chief Complaint  Patient presents with   Right Hand - Follow-up    EMG/NCV review   Left Hand - Follow-up    EMG/NCV review    HPI 67 year old female returns post EMGs nerve conduction velocities positive for moderate to severe left carpal tunnel syndrome and moderate right carpal tunnel syndrome.  More symptoms on the left than right.  She is also had some shoulder pain although she has full shoulder range of motion and she thinks it is related to holding her arm in this particular position due to the pain and numbness in her hands and fingers.  She has numbness in the radial 3 and half fingers that wakes her up at night she has used splints.  Previous C-section x 2 but no other surgeries.  Review of Systems negative for diabetes.  All other systems noncontributory to HPI.   Objective: Vital Signs: BP 135/76   Pulse 69   Ht 5' 5"$  (1.651 m)   Wt 180 lb (81.6 kg)   BMI 29.95 kg/m   Physical  Exam Constitutional:      Appearance: She is well-developed.  HENT:     Head: Normocephalic.     Right Ear: External ear normal.     Left Ear: External ear normal. There is no impacted cerumen.  Eyes:     Pupils: Pupils are equal, round, and reactive to light.  Neck:     Thyroid: No thyromegaly.     Trachea: No tracheal deviation.  Cardiovascular:     Rate and Rhythm: Normal rate.  Pulmonary:     Effort: Pulmonary effort is normal.  Abdominal:     Palpations: Abdomen is soft.  Musculoskeletal:     Cervical back: No rigidity.  Skin:    General: Skin is warm and dry.  Neurological:     Mental Status: She is alert and oriented to person, place, and time.  Psychiatric:        Behavior: Behavior normal.     Ortho Exam normal right and left brachial plexus tenderness reflexes normal.  No thenar atrophy.  Positive carpal compression test positive Phalen's test.  Specialty Comments:  02/23/2022 Cspine AP lateral cervical spine images are obtained and reviewed.  Normal  cervical curvature.  Trace narrowing at C5-6 and slight anterior  longitudinal  ligament calcification C4-5.  No significant facet  degenerative changes on AP images.   Impression: Minimal mid cervical radiographic changes.  Rodell Perna, MD  terpretation Summary  EMG & NCV Findings: Evaluation of the left median motor and the right median motor nerves showed prolonged distal onset latency (L6.0, R5.0 ms) and decreased conduction velocity (Elbow-Wrist, L46, R44 m/s).  The left median (across palm) sensory nerve showed prolonged distal peak latency (Wrist, 6.6 ms), reduced amplitude (8.2 V), and prolonged distal peak latency (Palm, 3.8 ms).  The right median (across palm) sensory nerve showed prolonged distal peak latency (Wrist, 5.0 ms).  The left ulnar sensory nerve showed reduced amplitude (13.7 V).  All remaining nerves (as indicated in the following tables) were within normal limits.  Left vs. Right side  comparison data for the median motor nerve indicates abnormal L-R latency difference (1.0 ms).     All examined muscles (as indicated in the following table) showed no evidence of electrical instability.     Impression: The above electrodiagnostic study is ABNORMAL and reveals evidence of:   a moderate to severe left median nerve entrapment at the wrist (carpal tunnel syndrome) affecting sensory and motor components.    a moderate right median nerve entrapment at the wrist (carpal tunnel syndrome) affecting sensory and motor components.    There is no significant electrodiagnostic evidence of any other focal nerve entrapment, brachial plexopathy or cervical radiculopathy.    Recommendations: 1.  Follow-up with referring physician. 2.  Continue current management of symptoms. 3.  Suggest surgical evaluation.   ___________________________ Wonda Olds Board Certified, American Board of Physical Medicine and Rehabilitation   Imaging: No results found.   PMFS History: Patient Active Problem List   Diagnosis Date Noted   Carpal tunnel syndrome, left upper limb 03/24/2022   Numbness and tingling in left hand 02/23/2022   Dyslipidemia 08/12/2021   Spinal stenosis of lumbar region 06/08/2019   Annual physical exam 12/07/2018   Insomnia 12/01/2017   RLS (restless legs syndrome) 11/03/2017   Anxiety 11/03/2017   Colon polyps 11/03/2017   PCP NOTES >>>>>>>>>>>>>>>>>>>>>> 06/01/2017   Diabetes mellitus ([place])    Hypertension    Thyroid disease    Past Medical History:  Diagnosis Date   Diabetes mellitus ([place])    Hypertension    Thyroid disease     Family History  Problem Relation Age of Onset   Arthritis Mother    Diabetes Father    CAD Neg Hx    Colon cancer Neg Hx    Breast cancer Neg Hx    Esophageal cancer Neg Hx    Stomach cancer Neg Hx    Rectal cancer Neg Hx     Past Surgical History:  Procedure Laterality Date   CESAREAN SECTION  [id number]   X2    COLONOSCOPY  10 years ago    in [place]="normal exam" per pt   Social History   Occupational History   Occupation: retired - Education officer, museum , [place]  Tobacco Use   Smoking status: Former    Types: Cigarettes    Quit date: 2000    Years since quitting: 24.1   Smokeless tobacco: Never   Tobacco comments:    light smoker, quit ~ 2000  Vaping Use   Vaping Use: Never used  Substance and Sexual Activity   Alcohol use: No   Drug use: Never   Sexual activity: Not on file

## 2022-05-07 ENCOUNTER — Encounter: Payer: Self-pay | Admitting: Internal Medicine

## 2022-05-07 ENCOUNTER — Ambulatory Visit (INDEPENDENT_AMBULATORY_CARE_PROVIDER_SITE_OTHER): Payer: Medicare Other | Admitting: Internal Medicine

## 2022-05-07 VITALS — BP 134/76 | HR 66 | Temp 98.2°F | Resp 16 | Ht 65.0 in | Wt 176.5 lb

## 2022-05-07 DIAGNOSIS — E785 Hyperlipidemia, unspecified: Secondary | ICD-10-CM

## 2022-05-07 DIAGNOSIS — I10 Essential (primary) hypertension: Secondary | ICD-10-CM | POA: Diagnosis not present

## 2022-05-07 DIAGNOSIS — E119 Type 2 diabetes mellitus without complications: Secondary | ICD-10-CM | POA: Diagnosis not present

## 2022-05-07 DIAGNOSIS — E079 Disorder of thyroid, unspecified: Secondary | ICD-10-CM

## 2022-05-07 DIAGNOSIS — E559 Vitamin D deficiency, unspecified: Secondary | ICD-10-CM | POA: Diagnosis not present

## 2022-05-07 LAB — LIPID PANEL
Cholesterol: 208 mg/dL — ABNORMAL HIGH (ref 0–200)
HDL: 69.3 mg/dL (ref >39.00)
LDL Cholesterol: 117 mg/dL — ABNORMAL HIGH (ref 0–99)
NonHDL: 138.28
Total CHOL/HDL Ratio: 3
Triglycerides: 107 mg/dL (ref 0.0–149.0)
VLDL: 21.4 mg/dL (ref 0.0–40.0)

## 2022-05-07 LAB — BASIC METABOLIC PANEL
BUN: 10 mg/dL (ref 6–23)
CO2: 30 mEq/L (ref 19–32)
Calcium: 9.8 mg/dL (ref 8.4–10.5)
Chloride: 101 mEq/L (ref 96–112)
Creatinine, Ser: 0.71 mg/dL (ref 0.40–1.20)
GFR: 88.39 mL/min (ref >60.00)
Glucose, Bld: 113 mg/dL — ABNORMAL HIGH (ref 70–99)
Potassium: 3.9 mEq/L (ref 3.5–5.1)
Sodium: 139 mEq/L (ref 135–145)

## 2022-05-07 LAB — MICROALBUMIN / CREATININE URINE RATIO
Creatinine,U: 53.5 mg/dL
Microalb Creat Ratio: 1.3 mg/g (ref 0.0–30.0)
Microalb, Ur: <0.7 mg/dL (ref 0.0–1.9)

## 2022-05-07 LAB — B12 AND FOLATE PANEL
Folate: 14 ng/mL (ref >5.9)
Vitamin B-12: 283 pg/mL (ref 211–911)

## 2022-05-07 LAB — TSH: TSH: 1.56 u[IU]/mL (ref 0.35–5.50)

## 2022-05-07 LAB — HEMOGLOBIN A1C: Hgb A1c MFr Bld: 6.8 % — ABNORMAL HIGH (ref 4.6–6.5)

## 2022-05-07 LAB — VITAMIN D 25 HYDROXY (VIT D DEFICIENCY, FRACTURES): VITD: 33.19 ng/mL (ref 30.00–100.00)

## 2022-05-07 NOTE — Progress Notes (Unsigned)
Subjective:    Patient ID: Kimberly Combs, female    DOB: [birth year], 67 y.o.   MRN: [id number]  DOS:  05/07/2022 Type of visit - description: Routine office visit  In general feeling well. Has been doing well with diet and exercise Patient only has fatigue. Denies fever or chills No chest pain, SOB or DOE.  No edema. No cough no headaches   Wt Readings from Last 3 Encounters:  05/07/22 176 lb 8 oz (80.1 kg)  03/24/22 180 lb (81.6 kg)  02/23/22 180 lb (81.6 kg)     Review of Systems See above   Past Medical History:  Diagnosis Date   Diabetes mellitus    Hypertension    Thyroid disease     Past Surgical History:  Procedure Laterality Date   CESAREAN SECTION  4098,1191   X2   COLONOSCOPY  10 years ago    in [place]="normal exam" per pt    Current Outpatient Medications  Medication Instructions   aspirin EC 81 mg, Daily   b complex vitamins tablet 1 tablet, Oral, Daily   Blood Glucose Monitoring Suppl (ONETOUCH VERIO FLEX SYSTEM) w/Device KIT Check blood sugar 3 times daily   cyclobenzaprine (FLEXERIL) 10 mg, Oral, 3 times daily PRN   glucose blood (ONETOUCH VERIO) test strip CHECK BLOOD SUGAR THREE TIMES DAILY EVERY DAY   losartan (COZAAR) 50 MG tablet TAKE 1 TABLET(50 MG) BY MOUTH DAILY   metFORMIN (GLUCOPHAGE-XR) 750 MG 24 hr tablet TAKE 2 TABLETS BY MOUTH EVERY MORNING AND 1 TABLET EVERY EVENING   ONE TOUCH LANCETS MISC Check blood sugar three times daily   Synthroid 125 mcg, Oral, Daily before breakfast       Objective:   Physical Exam BP 134/76   Pulse 66   Temp 98.2 F (36.8 C) (Oral)   Resp 16   Ht 5\' 5"  (1.651 m)   Wt 176 lb 8 oz (80.1 kg)   SpO2 96%   BMI 29.37 kg/m  General: Well developed, NAD, BMI noted Neck: No  thyromegaly  HEENT:  Normocephalic . Face symmetric, atraumatic Lungs:  CTA B Normal respiratory effort, no intercostal retractions, no accessory muscle use. Heart: RRR,  no murmur.  Abdomen:  Not distended, soft, non-tender. No  rebound or rigidity.   Lower extremities: no pretibial edema bilaterally  Skin: Exposed areas without rash. Not pale. Not jaundice Neurologic:  alert & oriented X3.  Speech normal, gait appropriate for age and unassisted Strength symmetric and appropriate for age.  Psych: Cognition and judgment appear intact.  Cooperative with normal attention span and concentration.  Behavior appropriate. No anxious or depressed appearing.     Assessment     Assessment (New patient 05/2017, used to see Dr. Nehemiah Settle) DM: dx ~ 2014 --Rybelsus: $$ --Jardiance: HA, nausea --Acarbose: ++ diarrrhea  HTN: dx ~ 2016 Hyperlipidemia: Declined statins (11-2020.  See OV April 5,2024) Hypothyroidism Anxiety, insomnia RLS, periodic limb movement per neuro 2019 MSK: Back pain, had MRI, DX lumbar stenosis (ortho OV 01-2019)  PLAN DM: Doing great with diet and exercise, ambulatory CBGs ranged from 100-129.  Check A1c, micro B12.  Continue metformin HTN: On losartan, BP looks very good, check BMP High cholesterol: On no treatment, benefits of statins including prevention of heart attack, stroke was discussed.  Although there is a potential for side effects they are typically uncommon.  She still declines and will let me know if she changes her mind.  Requested a FLP.  Will do Hypothyroidism:  Check TSH, RF with results. Fatigue: Mild, ROS negative.  Observation Vitamin D deficiency, history of.  Takes supplements sporadically, labs. Preventive care reviewed, see separate documentation.   RTC 6 months  -Td 2017 -Vaccines I recommend: PNM 20, Shingrix, RSV, COVID booster if not done after 10-2021, flu shot every fall.  CCS: [place] ( [place] ~ 2009, [facility],Tubular adenoma, C-scope 07/21/2021, next per GI Female care:  sees gyn, [facility] 06-2021. PAP 08-05-2021  per [facility] DEXA: 2016,   2021 (wnl), next 2026 ACP info provided    10 DM: Currently on metformin only, last A1c was elevated,  was Rx acarbose but developed diarrhea. Since then is doing great with diet.  CBGs in the morning around 100, postprandial around 120.  She hopes A1c will be better. If A1c elevated, options are limited: Rybelsus: $$ Jardiance: HA, nausea Acarbose: ++ diarrrhea  Thus consider Actos or glipizide. High cholesterol: Declines statins Hypothyroidism:  Check TSH Radiculopathy: See HPI, recommend to continue Tylenol, Rx Flexeril, watch for somnolence plans to reach out to her orthopedic doctor. Preventive care: Flu shot today, advised about the benefits of a COVID-vaccine. RTC 3 months

## 2022-05-07 NOTE — Patient Instructions (Addendum)
-  Vaccines I recommend: PNM 20, Shingrix, RSV, COVID booster if not done after 10-2021, flu shot every fall.   Check the  blood pressure regularly BP GOAL is between 110/65 and  135/85. If it is consistently higher or lower, let me know      GO TO THE LAB : Get the blood work     GO TO THE FRONT DESK, PLEASE SCHEDULE YOUR APPOINTMENTS Come back for   a checkup in 6 months    "Health Care Power of attorney" ,  "Living will" (Advance care planning documents)  If you already have a living will or healthcare power of attorney, is recommended you bring the copy to be scanned in your chart.   The document will be available to all the doctors you see in the system.  Advance care planning is a process that supports adults in  understanding and sharing their preferences regarding future medical care.  The patient's preferences are recorded in documents called Advance Directives and the can be modified at any time while the patient is in full mental capacity.   If you don't have one, please consider create one.      More information at: [email or web address]

## 2022-05-08 NOTE — Assessment & Plan Note (Signed)
DM: Doing great with diet and exercise, ambulatory CBGs ranged from 100-129.  Check A1c, micro B12.  Continue metformin HTN: On losartan, BP looks very good, check BMP High cholesterol: On no treatment, benefits of statins including prevention of heart attack, strokes.  Although there is a potential for side effects they are typically uncommon.  She still declines and will let me know if she changes her mind.  Requested a FLP.  Will do Hypothyroidism: Check TSH, RF with results. Fatigue: Mild, ROS negative.  Observation Vitamin D deficiency, history of.  Takes supplements sporadically, labs. Preventive care reviewed, see separate documentation.   RTC 6 months

## 2022-05-08 NOTE — Assessment & Plan Note (Signed)
Preventive care reviewed -Td 2017 -Vaccines I recommend: PNM 20, Shingrix, RSV, COVID booster if not done after 10-2021, flu shot every fall. CCS: Cscope ( [place]) ~ 2009, s/p colonoscopy in [facility] 07/04/2017,Tubular adenoma, C-scope 07/21/2021, next per GI Female care:  sees gyn, [facility] 06-2021. PAP 08-05-2021  per [facility] DEXA: 2016,   2021 (wnl), next 2026 ACP info provided

## 2022-05-27 ENCOUNTER — Encounter: Payer: Self-pay | Admitting: Internal Medicine

## 2022-06-07 LAB — HM DIABETES EYE EXAM

## 2022-06-15 ENCOUNTER — Other Ambulatory Visit: Payer: Self-pay | Admitting: Family

## 2022-06-22 ENCOUNTER — Telehealth: Payer: Self-pay | Admitting: Internal Medicine

## 2022-06-22 ENCOUNTER — Other Ambulatory Visit: Payer: Self-pay | Admitting: Internal Medicine

## 2022-06-22 MED ORDER — LOSARTAN POTASSIUM 50 MG PO TABS: 50.0000 mg | ORAL_TABLET | Freq: Every day | ORAL | 1 refills | Status: DC

## 2022-06-22 NOTE — Addendum Note (Signed)
Addended byConrad Greenwood D on: 06/22/2022 11:09 AM   Modules accepted: Orders

## 2022-06-22 NOTE — Telephone Encounter (Signed)
Rx sent 

## 2022-06-22 NOTE — Telephone Encounter (Signed)
Prescription Request  06/22/2022  Is this a "Controlled Substance" medicine? No  LOV: 05/07/2022  What is the name of the medication or equipment?  losartan (COZAAR) 50 MG tablet   Have you contacted your pharmacy to request a refill? Yes   Which pharmacy would you like this sent to?  [id number] [place], [place] - [address] AT [facility] [address] [place] [address] Phone: [phone] Fax: [phone]    Patient notified that their request is being sent to the clinical staff for review and that they should receive a response within 2 business days.   Please advise at Mobile [phone] (mobile)

## 2022-07-07 ENCOUNTER — Other Ambulatory Visit: Payer: Self-pay | Admitting: Internal Medicine

## 2022-08-13 ENCOUNTER — Ambulatory Visit (INDEPENDENT_AMBULATORY_CARE_PROVIDER_SITE_OTHER): Payer: Medicare Other | Admitting: Physician Assistant

## 2022-08-13 ENCOUNTER — Encounter: Payer: Self-pay | Admitting: Physician Assistant

## 2022-08-13 ENCOUNTER — Other Ambulatory Visit: Payer: Self-pay | Admitting: Internal Medicine

## 2022-08-13 DIAGNOSIS — G5602 Carpal tunnel syndrome, left upper limb: Secondary | ICD-10-CM | POA: Diagnosis not present

## 2022-08-13 DIAGNOSIS — Z1231 Encounter for screening mammogram for malignant neoplasm of breast: Secondary | ICD-10-CM

## 2022-08-13 MED ORDER — LIDOCAINE HCL 1 % IJ SOLN
1.0000 mL | INTRAMUSCULAR | Status: AC | PRN
Start: 2022-08-13 — End: 2022-08-13
  Administered 2022-08-13: 1 mL

## 2022-08-13 MED ORDER — METHYLPREDNISOLONE ACETATE 40 MG/ML IJ SUSP
40.0000 mg | INTRAMUSCULAR | Status: AC | PRN
Start: 2022-08-13 — End: 2022-08-13
  Administered 2022-08-13: 40 mg

## 2022-08-13 MED ORDER — BUPIVACAINE HCL 0.25 % IJ SOLN
1.0000 mL | INTRAMUSCULAR | Status: AC | PRN
Start: 2022-08-13 — End: 2022-08-13
  Administered 2022-08-13: 1 mL

## 2022-08-13 NOTE — Progress Notes (Signed)
Office Visit Note   Patient: Kimberly Combs           Date of Birth: 01-28-1956           MRN: [id number] Visit Date: 08/13/2022              Requested by: Wanda Plump, MD [address] [place],  [place] [address] PCP: Wanda Plump, MD   Assessment & Plan: Visit Diagnoses:  1. Carpal tunnel syndrome, left upper limb     Plan: Impression is left carpal tunnel syndrome.  Today, we discussed various treatment options to include repeat cortisone injection versus surgical intervention which I recommended based on the severity of compression.  She is elected to proceed with 1 more cortisone injection and then follow-up with Dr. Ophelia Charter to discuss surgery.  Follow-Up Instructions: Return if symptoms worsen or fail to improve.   Orders:  Orders Placed This Encounter  Procedures   Hand/UE Inj   No orders of the defined types were placed in this encounter.     Procedures: Hand/UE Inj: L carpal tunnel for carpal tunnel syndrome on 08/13/2022 10:50 AM Indications: pain Details: 25 G needle, volar approach Medications: 1 mL lidocaine 1 %; 1 mL bupivacaine 0.25 %; 40 mg methylPREDNISolone acetate 40 MG/ML      Clinical Data: No additional findings.   Subjective: Chief Complaint  Patient presents with   Left Wrist - Follow-up    HPI patient is a pleasant 67 year old female who comes in today with recurrent left hand pain and paresthesias.  History of carpal tunnel syndrome which is moderate to severe based on recent nerve conduction studies.  She was seen by Dr. Ophelia Charter on March 24, 2022 where left hand carpal tunnel was injected with cortisone.  She noted good relief following the injection until about 3 to 4 weeks ago.  Symptoms have returned.  She is complaining of pain and paresthesias throughout the median nerve distribution radiating up the forearm.  Worse at night when she is trying to sleep.  She has tried wearing night splints which really did not help much.  She  is thinking about surgery but would like 1 more cortisone injection today.  Review of Systems as detailed in HPI.  All others reviewed and are negative.   Objective: Vital Signs: There were no vitals taken for this visit.  Physical Exam well-developed well-nourished female no acute distress.  Alert and oriented x 3.  Ortho Exam left hand exam: Positive Phalen and Tinel.  No thenar atrophy.  Specialty Comments:  02/23/2022 Cspine AP lateral cervical spine images are obtained and reviewed.  Normal  cervical curvature.  Trace narrowing at C5-6 and slight anterior  longitudinal ligament calcification C4-5.  No significant facet  degenerative changes on AP images.   Impression: Minimal mid cervical radiographic changes.  Annell Greening, MD  Imaging: No new imaging   PMFS History: Patient Active Problem List   Diagnosis Date Noted   Carpal tunnel syndrome, left upper limb 03/24/2022   Numbness and tingling in left hand 02/23/2022   Dyslipidemia 08/12/2021   Spinal stenosis of lumbar region 06/08/2019   Annual physical exam 12/07/2018   Insomnia 12/01/2017   RLS (restless legs syndrome) 11/03/2017   Anxiety 11/03/2017   Colon polyps 11/03/2017   PCP NOTES >>>>>>>>>>>>>>>>>>>>>> 06/01/2017   Diabetes mellitus (HCC)    Hypertension    Thyroid disease    Past Medical History:  Diagnosis Date   Diabetes mellitus (  HCC)    Hypertension    Thyroid disease     Family History  Problem Relation Age of Onset   Arthritis Mother    Diabetes Father    CAD Neg Hx    Colon cancer Neg Hx    Breast cancer Neg Hx    Esophageal cancer Neg Hx    Stomach cancer Neg Hx    Rectal cancer Neg Hx     Past Surgical History:  Procedure Laterality Date   CESAREAN SECTION  1610,9604   X2   COLONOSCOPY  10 years ago    in [place]="normal exam" per pt   Social History   Occupational History   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

## 2022-08-17 ENCOUNTER — Ambulatory Visit
Admission: RE | Admit: 2022-08-17 | Discharge: 2022-08-17 | Disposition: A | Discharge status: Home / Self Care | Payer: Medicare Other | Source: Ambulatory Visit | Attending: Internal Medicine | Admitting: Internal Medicine

## 2022-08-17 DIAGNOSIS — Z1231 Encounter for screening mammogram for malignant neoplasm of breast: Secondary | ICD-10-CM

## 2022-08-27 ENCOUNTER — Telehealth: Payer: Self-pay | Admitting: Internal Medicine

## 2022-08-27 NOTE — Telephone Encounter (Signed)
Copied from CRM [id number]. Topic: Medicare AWV >> Aug 27, 2022 11:15 AM Payton Doughty wrote: Reason for CRM: LM 08/27/2022 to schedule AWV   Verlee Rossetti; Care Guide Ambulatory Clinical Support [place] [facility] Direct Dial: [phone]

## 2022-08-31 ENCOUNTER — Ambulatory Visit (INDEPENDENT_AMBULATORY_CARE_PROVIDER_SITE_OTHER): Payer: Medicare Other | Admitting: *Deleted

## 2022-08-31 VITALS — BP 128/75 | HR 69 | Ht 65.0 in | Wt 182.8 lb

## 2022-08-31 DIAGNOSIS — Z0000 Encounter for general adult medical examination without abnormal findings: Secondary | ICD-10-CM

## 2022-08-31 NOTE — Patient Instructions (Signed)
Ms. Camhi , Thank you for taking time to come for your Medicare Wellness Visit. I appreciate your ongoing commitment to your health goals. Please review the following plan we discussed and let me know if I can assist you in the future.   These are the goals we discussed:  Goals   None     This is a list of the screening recommended for you and due dates:  Health Maintenance  Topic Date Due   Zoster (Shingles) Vaccine (1 of 2) Never done   Pneumonia Vaccine (2 of 2 - PCV) 08/02/2020   COVID-19 Vaccine (5 - 2023-24 season) 10/02/2021   Eye exam for diabetics  05/23/2022   Complete foot exam   08/13/2022   Flu Shot  09/02/2022   Hemoglobin A1C  11/06/2022   Yearly kidney function blood test for diabetes  05/07/2023   Yearly kidney health urinalysis for diabetes  05/07/2023   Medicare Annual Wellness Visit  08/31/2023   Colon Cancer Screening  07/21/2024   Mammogram  08/16/2024   DTaP/Tdap/Td vaccine (2 - Td or Tdap) 10/06/2025   DEXA scan (bone density measurement)  Completed   Hepatitis C Screening  Completed   HPV Vaccine  Aged Out     Next appointment: Follow up in one year for your annual wellness visit.   Preventive Care 71 Years and Older, Female Preventive care refers to lifestyle choices and visits with your health care provider that can promote health and wellness. What does preventive care include? A yearly physical exam. This is also called an annual well check. Dental exams once or twice a year. Routine eye exams. Ask your health care provider how often you should have your eyes checked. Personal lifestyle choices, including: Daily care of your teeth and gums. Regular physical activity. Eating a healthy diet. Avoiding tobacco and drug use. Limiting alcohol use. Practicing safe sex. Taking low-dose aspirin every day. Taking vitamin and mineral supplements as recommended by your health care provider. What happens during an annual well check? The services and  screenings done by your health care provider during your annual well check will depend on your age, overall health, lifestyle risk factors, and family history of disease. Counseling  Your health care provider may ask you questions about your: Alcohol use. Tobacco use. Drug use. Emotional well-being. Home and relationship well-being. Sexual activity. Eating habits. History of falls. Memory and ability to understand (cognition). Work and work Astronomer. Reproductive health. Screening  You may have the following tests or measurements: Height, weight, and BMI. Blood pressure. Lipid and cholesterol levels. These may be checked every 5 years, or more frequently if you are over 24 years old. Skin check. Lung cancer screening. You may have this screening every year starting at age 72 if you have a 30-pack-year history of smoking and currently smoke or have quit within the past 15 years. Fecal occult blood test (FOBT) of the stool. You may have this test every year starting at age 40. Flexible sigmoidoscopy or colonoscopy. You may have a sigmoidoscopy every 5 years or a colonoscopy every 10 years starting at age 10. Hepatitis C blood test. Hepatitis B blood test. Sexually transmitted disease (STD) testing. Diabetes screening. This is done by checking your blood sugar (glucose) after you have not eaten for a while (fasting). You may have this done every 1-3 years. Bone density scan. This is done to screen for osteoporosis. You may have this done starting at age 84. Mammogram. This may be done  every 1-2 years. Talk to your health care provider about how often you should have regular mammograms. Talk with your health care provider about your test results, treatment options, and if necessary, the need for more tests. Vaccines  Your health care provider may recommend certain vaccines, such as: Influenza vaccine. This is recommended every year. Tetanus, diphtheria, and acellular pertussis (Tdap,  Td) vaccine. You may need a Td booster every 10 years. Zoster vaccine. You may need this after age 35. Pneumococcal 13-valent conjugate (PCV13) vaccine. One dose is recommended after age 39. Pneumococcal polysaccharide (PPSV23) vaccine. One dose is recommended after age 52. Talk to your health care provider about which screenings and vaccines you need and how often you need them. This information is not intended to replace advice given to you by your health care provider. Make sure you discuss any questions you have with your health care provider. Document Released: 02/14/2015 Document Revised: 10/08/2015 Document Reviewed: 11/19/2014 Elsevier Interactive Patient Education  2017 [employer].  Fall Prevention in the Home Falls can cause injuries. They can happen to people of all ages. There are many things you can do to make your home safe and to help prevent falls. What can I do on the outside of my home? Regularly fix the edges of walkways and driveways and fix any cracks. Remove anything that might make you trip as you walk through a door, such as a raised step or threshold. Trim any bushes or trees on the path to your home. Use bright outdoor lighting. Clear any walking paths of anything that might make someone trip, such as rocks or tools. Regularly check to see if handrails are loose or broken. Make sure that both sides of any steps have handrails. Any raised decks and porches should have guardrails on the edges. Have any leaves, snow, or ice cleared regularly. Use sand or salt on walking paths during winter. Clean up any spills in your garage right away. This includes oil or grease spills. What can I do in the bathroom? Use night lights. Install grab bars by the toilet and in the tub and shower. Do not use towel bars as grab bars. Use non-skid mats or decals in the tub or shower. If you need to sit down in the shower, use a plastic, non-slip stool. Keep the floor dry. Clean up any  water that spills on the floor as soon as it happens. Remove soap buildup in the tub or shower regularly. Attach bath mats securely with double-sided non-slip rug tape. Do not have throw rugs and other things on the floor that can make you trip. What can I do in the bedroom? Use night lights. Make sure that you have a light by your bed that is easy to reach. Do not use any sheets or blankets that are too big for your bed. They should not hang down onto the floor. Have a firm chair that has side arms. You can use this for support while you get dressed. Do not have throw rugs and other things on the floor that can make you trip. What can I do in the kitchen? Clean up any spills right away. Avoid walking on wet floors. Keep items that you use a lot in easy-to-reach places. If you need to reach something above you, use a strong step stool that has a grab bar. Keep electrical cords out of the way. Do not use floor polish or wax that makes floors slippery. If you must use wax, use  non-skid floor wax. Do not have throw rugs and other things on the floor that can make you trip. What can I do with my stairs? Do not leave any items on the stairs. Make sure that there are handrails on both sides of the stairs and use them. Fix handrails that are broken or loose. Make sure that handrails are as long as the stairways. Check any carpeting to make sure that it is firmly attached to the stairs. Fix any carpet that is loose or worn. Avoid having throw rugs at the top or bottom of the stairs. If you do have throw rugs, attach them to the floor with carpet tape. Make sure that you have a light switch at the top of the stairs and the bottom of the stairs. If you do not have them, ask someone to add them for you. What else can I do to help prevent falls? Wear shoes that: Do not have high heels. Have rubber bottoms. Are comfortable and fit you well. Are closed at the toe. Do not wear sandals. If you use a  stepladder: Make sure that it is fully opened. Do not climb a closed stepladder. Make sure that both sides of the stepladder are locked into place. Ask someone to hold it for you, if possible. Clearly mark and make sure that you can see: Any grab bars or handrails. First and last steps. Where the edge of each step is. Use tools that help you move around (mobility aids) if they are needed. These include: Canes. Walkers. Scooters. Crutches. Turn on the lights when you go into a dark area. Replace any light bulbs as soon as they burn out. Set up your furniture so you have a clear path. Avoid moving your furniture around. If any of your floors are uneven, fix them. If there are any pets around you, be aware of where they are. Review your medicines with your doctor. Some medicines can make you feel dizzy. This can increase your chance of falling. Ask your doctor what other things that you can do to help prevent falls. This information is not intended to replace advice given to you by your health care provider. Make sure you discuss any questions you have with your health care provider. Document Released: 11/14/2008 Document Revised: 06/26/2015 Document Reviewed: 02/22/2014 Elsevier Interactive Patient Education  2017 [employer].

## 2022-08-31 NOTE — Progress Notes (Signed)
Subjective:   Kimberly Combs is a 67 y.o. female who presents for an Initial Medicare Annual Wellness Visit.  Visit Complete: In person   Review of Systems     Cardiac Risk Factors include: advanced age (>57men, >66 women);obesity (BMI >30kg/m2);diabetes mellitus;dyslipidemia;hypertension     Objective:    Today's Vitals   08/31/22 1010  BP: 128/75  Pulse: 69  Weight: 182 lb 12.8 oz (82.9 kg)  Height: 5\' 5"  (1.651 m)   Body mass index is 30.42 kg/m.     08/31/2022   10:23 AM 04/07/2018    9:47 AM 01/26/2015   11:06 AM  Advanced Directives  Does Patient Have a Medical Advance Directive? No No No  Would patient like information on creating a medical advance directive? No - Patient declined No - Patient declined No - patient declined information    Current Medications (verified) Outpatient Encounter Medications as of 08/31/2022  Medication Sig   aspirin EC 81 MG tablet Take 81 mg by mouth daily. Swallow whole.   b complex vitamins tablet Take 1 tablet by mouth daily.   Blood Glucose Monitoring Suppl (ONETOUCH VERIO FLEX SYSTEM) w/Device KIT Check blood sugar 3 times daily   cyclobenzaprine (FLEXERIL) 10 MG tablet Take 1 tablet (10 mg total) by mouth 3 (three) times daily as needed for muscle spasms.   glucose blood (ONETOUCH VERIO) test strip CHECK BLOOD SUGAR THREE TIMES DAILY EVERY DAY   losartan (COZAAR) 50 MG tablet Take 1 tablet (50 mg total) by mouth daily.   metFORMIN (GLUCOPHAGE-XR) 750 MG 24 hr tablet TAKE 2 TABLETS BY MOUTH EVERY MORNING AND 1 TABLET EVERY EVENING   ONE TOUCH LANCETS MISC Check blood sugar three times daily   SYNTHROID 125 MCG tablet Take 1 tablet (125 mcg total) by mouth daily before breakfast.   No facility-administered encounter medications on file as of 08/31/2022.    Allergies (verified) Acarbose and Jardiance [empagliflozin]   History: Past Medical History:  Diagnosis Date   Diabetes mellitus (HCC)    Hypertension    Thyroid disease     Past Surgical History:  Procedure Laterality Date   CESAREAN SECTION  [phone]   X2   COLONOSCOPY  10 years ago    in [place]="normal exam" per pt   Family History  Problem Relation Age of Onset   Arthritis Mother    Diabetes Father    CAD Neg Hx    Colon cancer Neg Hx    Breast cancer Neg Hx    Esophageal cancer Neg Hx    Stomach cancer Neg Hx    Rectal cancer Neg Hx    Social History   Socioeconomic History   Marital status: Married    Spouse name: Ray   Number of children: 2   Years of education: Not on file   Highest education level: Bachelor's degree (e.g., BA, AB, BS)  Occupational History   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 She lives with her husband in a 2 story house. She drinks one cup of deca coffee most days. She and her husband walk occasionally.   Social Determinants of Health   Financial Resource Strain: Low Risk  (08/31/2022)   Overall Financial Resource Strain (CARDIA)    Difficulty of Paying Living Expenses: Not hard at all  Food Insecurity: No Food Insecurity (08/31/2022)   Hunger Vital Sign    Worried About Running Out of Food in the Last Year: Never true    Ran Out of Food in the Last Year: Never true  Transportation Needs: No Transportation Needs (08/31/2022)   PRAPARE - Administrator, Civil Service (Medical): No    Lack of Transportation (Non-Medical): No  Physical Activity: Sufficiently Active (08/31/2022)   Exercise Vital Sign     Days of Exercise per Week: 7 days    Minutes of Exercise per Session: 30 min  Stress: No Stress Concern Present (08/31/2022)   [employer] of Occupational Health - Occupational Stress Questionnaire    Feeling of Stress : Not at all  Social Connections: Moderately Integrated (08/31/2022)   Social Connection and Isolation Panel [NHANES]    Frequency of Communication with Friends and Family: More than three times a week    Frequency of Social Gatherings with Friends and Family: Once a week    Attends Religious Services: More than 4 times per year    Active Member of [employer] or Organizations: No    Attends Engineer, structural: Never    Marital Status: Married    Tobacco Counseling Counseling given: Not Answered Tobacco comments: light smoker, quit ~ 2000   Clinical Intake:  Pre-visit preparation completed: Yes  Pain : No/denies pain  BMI - recorded: 30.42 Nutritional Status: BMI > 30  Obese Nutritional Risks: None Diabetes: Yes CBG done?: No Did pt. bring in CBG monitor from home?: No  How often do you need to have someone help you when you read instructions, pamphlets, or other written materials from your doctor or pharmacy?: 1 - Never  Interpreter Needed?: No  Information entered by :: Donne Anon, CMA   Activities of Daily Living    08/31/2022   10:17 AM  In your present state of health, do you have any difficulty performing the following activities:  Hearing? 0  Vision? 0  Difficulty concentrating or making decisions? 0  Walking or climbing stairs? 0  Dressing or bathing? 0  Doing errands, shopping? 0  Preparing Food and eating ? N  Using the Toilet? N  In the past six months, have you accidently leaked urine? N  Do you have problems with loss of bowel control? N  Managing your Medications? N  Managing your Finances? N  Housekeeping or managing your Housekeeping? N    Patient Care Team: Wanda Plump, MD as PCP - General (Internal  Medicine) Joana Reamer Marveen Reeks, MD as Referring Physician (Ophthalmology) Myna Hidalgo, DO as Consulting Physician (Obstetrics and Gynecology) Genene Churn, DC as Referring Physician (Chiropractic Medicine)  Indicate any recent Medical Services you may have received from other than [facility] providers in the past year (date may be approximate).     Assessment:   This is a routine wellness examination for [place].  Hearing/Vision screen No results found.  Dietary issues and exercise activities discussed:     Goals Addressed  None    Depression Screen    08/31/2022   10:21 AM 05/07/2022    9:40 AM 11/26/2021    9:47 AM 08/12/2021   10:22 AM 11/14/2020    9:35 AM 06/03/2020    9:27 AM 12/07/2018    9:19 AM  PHQ 2/9 Scores  PHQ - 2 Score 0 0 0 0 0 0 0    Fall Risk    08/31/2022   10:19 AM 05/07/2022    9:40 AM 11/26/2021    9:47 AM 08/12/2021   10:22 AM 11/14/2020    9:29 AM  Fall Risk   Falls in the past year? 0 0 0 0 0  Number falls in past yr: 0 0 0 0 0  Injury with Fall? 0 0 0 0 0  Risk for fall due to : No Fall Risks      Follow up Falls evaluation completed Falls evaluation completed Falls evaluation completed Falls evaluation completed Falls evaluation completed    MEDICARE RISK AT HOME:  Medicare Risk at Home - 08/31/22 1018     Any stairs in or around the home? Yes    If so, are there any without handrails? No    Home free of loose throw rugs in walkways, pet beds, electrical cords, etc? Yes    Adequate lighting in your home to reduce risk of falls? Yes    Life alert? No    Use of a cane, walker or w/c? No    Grab bars in the bathroom? No    Shower chair or bench in shower? No    Elevated toilet seat or a handicapped toilet? No             TIMED UP AND GO:  Was the test performed? Yes  Length of time to ambulate 10 feet: 5 sec Gait steady and fast without use of assistive device    Cognitive Function:        08/31/2022   10:23 AM  6CIT Screen   What Year? 0 points  What month? 0 points  What time? 3 points  Count back from 20 0 points  Months in reverse 0 points  Repeat phrase 0 points  Total Score 3 points    Immunizations Immunization History  Administered Date(s) Administered   Fluad Quad(high Dose 65+) 11/14/2020, 11/26/2021   Influenza,inj,Quad PF,6+ Mos 11/02/2017, 12/07/2018, 11/19/2019   Moderna SARS-COV2 Booster Vaccination 06/03/2020   Moderna Sars-Covid-2 Vaccination 06/15/2019, 07/16/2019, 02/15/2020   Pneumococcal Polysaccharide-23 09/24/2014, 11/02/2017   Tdap 10/07/2015    TDAP status: Up to date  Flu Vaccine status: Up to date  Pneumococcal vaccine status: Due, Education has been provided regarding the importance of this vaccine. Advised may receive this vaccine at local pharmacy or Health Dept. Aware to provide a copy of the vaccination record if obtained from local pharmacy or Health Dept. Verbalized acceptance and understanding.  Covid-19 vaccine status: Information provided on how to obtain vaccines.   Qualifies for Shingles Vaccine? Yes   Zostavax completed No   Shingrix Completed?: No.    Education has been provided regarding the importance of this vaccine. Patient has been advised to call insurance company to determine out of pocket expense if they have not yet received this vaccine. Advised may also receive vaccine at local pharmacy or Health Dept. Verbalized acceptance and understanding.  Screening Tests Health Maintenance  Topic Date Due   Medicare Annual Wellness (AWV)  Never done   Zoster Vaccines- Shingrix (1 of  2) Never done   Pneumonia Vaccine 52+ Years old (2 of 2 - PCV) 08/02/2020   COVID-19 Vaccine (5 - 2023-24 season) 10/02/2021   OPHTHALMOLOGY EXAM  05/23/2022   FOOT EXAM  08/13/2022   INFLUENZA VACCINE  09/02/2022   HEMOGLOBIN A1C  11/06/2022   Diabetic kidney evaluation - eGFR measurement  05/07/2023   Diabetic kidney evaluation - Urine ACR  05/07/2023   Colonoscopy   07/21/2024   MAMMOGRAM  08/16/2024   DTaP/Tdap/Td (2 - Td or Tdap) 10/06/2025   DEXA SCAN  Completed   Hepatitis C Screening  Completed   HPV VACCINES  Aged Out    Health Maintenance  Health Maintenance Due  Topic Date Due   Medicare Annual Wellness (AWV)  Never done   Zoster Vaccines- Shingrix (1 of 2) Never done   Pneumonia Vaccine 10+ Years old (2 of 2 - PCV) 08/02/2020   COVID-19 Vaccine (5 - 2023-24 season) 10/02/2021   OPHTHALMOLOGY EXAM  05/23/2022   FOOT EXAM  08/13/2022    Colorectal cancer screening: Type of screening: Colonoscopy. Completed 07/21/21. Repeat every 3 years  Mammogram status: Completed 08/17/22. Repeat every year  Bone Density status: Completed 12/03/19. Results reflect: Bone density results: NORMAL. Repeat every 2 years.  Lung Cancer Screening: (Low Dose CT Chest recommended if Age 26-80 years, 20 pack-year currently smoking OR have quit w/in 15years.) does not qualify.   Additional Screening:  Hepatitis C Screening: does qualify; Completed 06/07/19  Vision Screening: Recommended annual ophthalmology exams for early detection of glaucoma and other disorders of the eye. Is the patient up to date with their annual eye exam?  Yes  Who is the provider or what is the name of the office in which the patient attends annual eye exams? Dr. Isaias Cowman If pt is not established with a provider, would they like to be referred to a provider to establish care? No .   Dental Screening: Recommended annual dental exams for proper oral hygiene  Diabetic Foot Exam: Diabetic Foot Exam: Overdue, Pt has been advised about the importance in completing this exam. Pt is scheduled for diabetic foot exam on N/a.  Community Resource Referral / Chronic Care Management: CRR required this visit?  No   CCM required this visit?  No     Plan:     I have personally reviewed and noted the following in the patient's chart:   Medical and social history Use of alcohol, tobacco or  illicit drugs  Current medications and supplements including opioid prescriptions. Patient is not currently taking opioid prescriptions. Functional ability and status Nutritional status Physical activity Advanced directives List of other physicians Hospitalizations, surgeries, and ER visits in previous 12 months Vitals Screenings to include cognitive, depression, and falls Referrals and appointments  In addition, I have reviewed and discussed with patient certain preventive protocols, quality metrics, and best practice recommendations. A written personalized care plan for preventive services as well as general preventive health recommendations were provided to patient.     Donne Anon, CMA   08/31/2022   After Visit Summary: Printed for pt.  Nurse Notes: None

## 2022-09-13 ENCOUNTER — Other Ambulatory Visit: Payer: Self-pay | Admitting: Internal Medicine

## 2022-09-23 ENCOUNTER — Encounter: Payer: Self-pay | Admitting: Internal Medicine

## 2022-10-05 ENCOUNTER — Ambulatory Visit (INDEPENDENT_AMBULATORY_CARE_PROVIDER_SITE_OTHER): Payer: Medicare Other | Admitting: Orthopaedic Surgery

## 2022-10-05 ENCOUNTER — Other Ambulatory Visit: Payer: Self-pay | Admitting: Internal Medicine

## 2022-10-05 VITALS — BP 182/90 | HR 83

## 2022-10-05 DIAGNOSIS — G5602 Carpal tunnel syndrome, left upper limb: Secondary | ICD-10-CM | POA: Diagnosis not present

## 2022-10-05 NOTE — Progress Notes (Signed)
Office Visit Note   Patient: Kimberly Combs           Date of Birth: Dec 31, 1955           MRN: [id number] Visit Date: 10/05/2022              Requested by: Wanda Plump, MD [address] [place],  [place] [address] PCP: Wanda Plump, MD   Assessment & Plan: Visit Diagnoses:  1. Carpal tunnel syndrome, left upper limb     Plan: Patient will call when she gets back from [place] taking care of her mother and let us know when she is ready to schedule her left carpal tunnel release at [facility] day surgery under MAC anesthesia.  Follow-Up Instructions: No follow-ups on file.   Orders:  No orders of the defined types were placed in this encounter.  No orders of the defined types were placed in this encounter.     Procedures: No procedures performed   Clinical Data: No additional findings.   Subjective: Chief Complaint  Patient presents with   Left Hand - Pain    HPI 67 year old female returns with ongoing problems with left carpal tunnel syndrome.  She has had 2 injections she has a splint.  She states her mother in [place] is getting ready to have heart surgery in 2 weeks and she states she wants to proceed with the carpal tunnel surgery but have it after she gets back from going to [place] to help take care of her mother after her heart surgery.  Patient does have diabetes and is on metformin.  Last A1c 6.8.  Review of Systems all systems updated unchanged.   Objective: Vital Signs: BP (!) 182/90   Pulse 83   Physical Exam Constitutional:      Appearance: She is well-developed.  HENT:     Head: Normocephalic.     Right Ear: External ear normal.     Left Ear: External ear normal. There is no impacted cerumen.  Eyes:     Pupils: Pupils are equal, round, and reactive to light.  Neck:     Thyroid: No thyromegaly.     Trachea: No tracheal deviation.  Cardiovascular:     Rate and Rhythm: Normal rate.  Pulmonary:     Effort: Pulmonary effort is  normal.  Abdominal:     Palpations: Abdomen is soft.  Musculoskeletal:     Cervical back: No rigidity.  Skin:    General: Skin is warm and dry.  Neurological:     Mental Status: She is alert and oriented to person, place, and time.  Psychiatric:        Behavior: Behavior normal.     Ortho Exam carpal compression positive Phalen's test.  No thenar weakness.  Ulnar sensation and motor is normal. Good cervical range of motion no brachioplexus tenderness. Specialty Comments:  02/23/2022 Cspine AP lateral cervical spine images are obtained and reviewed.  Normal  cervical curvature.  Trace narrowing at C5-6 and slight anterior  longitudinal ligament calcification C4-5.  No significant facet  degenerative changes on AP images.   Impression: Minimal mid cervical radiographic changes.  Annell Greening, MD  Imaging: No results found.   PMFS History: Patient Active Problem List   Diagnosis Date Noted   Carpal tunnel syndrome, left upper limb 03/24/2022   Numbness and tingling in left hand 02/23/2022   Dyslipidemia 08/12/2021   Spinal stenosis of lumbar region 06/08/2019  Annual physical exam 12/07/2018   Insomnia 12/01/2017   RLS (restless legs syndrome) 11/03/2017   Anxiety 11/03/2017   Colon polyps 11/03/2017   PCP NOTES >>>>>>>>>>>>>>>>>>>>>> 06/01/2017   Diabetes mellitus (HCC)    Hypertension    Thyroid disease    Past Medical History:  Diagnosis Date   Diabetes mellitus (HCC)    Hypertension    Thyroid disease     Family History  Problem Relation Age of Onset   Arthritis Mother    Diabetes Father    CAD Neg Hx    Colon cancer Neg Hx    Breast cancer Neg Hx    Esophageal cancer Neg Hx    Stomach cancer Neg Hx    Rectal cancer Neg Hx     Past Surgical History:  Procedure Laterality Date   CESAREAN SECTION  3086,5784   X2   COLONOSCOPY  10 years ago    in [place]="normal exam" per pt   Social History   Occupational History   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

## 2022-11-04 ENCOUNTER — Other Ambulatory Visit: Payer: Self-pay | Admitting: Otolaryngology

## 2022-11-04 DIAGNOSIS — H93A9 Pulsatile tinnitus, unspecified ear: Secondary | ICD-10-CM

## 2022-11-09 ENCOUNTER — Ambulatory Visit: Payer: Medicare Other | Admitting: Internal Medicine

## 2022-11-11 ENCOUNTER — Ambulatory Visit
Admission: RE | Admit: 2022-11-11 | Discharge: 2022-11-11 | Disposition: A | Discharge status: Home / Self Care | Payer: Medicare Other | Source: Ambulatory Visit | Attending: Otolaryngology | Admitting: Otolaryngology

## 2022-11-11 DIAGNOSIS — H93A9 Pulsatile tinnitus, unspecified ear: Secondary | ICD-10-CM

## 2022-11-29 ENCOUNTER — Ambulatory Visit (INDEPENDENT_AMBULATORY_CARE_PROVIDER_SITE_OTHER): Payer: Medicare Other | Admitting: Internal Medicine

## 2022-11-29 ENCOUNTER — Encounter: Payer: Self-pay | Admitting: Internal Medicine

## 2022-11-29 VITALS — BP 126/82 | HR 68 | Temp 98.4°F | Resp 16 | Ht 65.0 in | Wt 186.5 lb

## 2022-11-29 DIAGNOSIS — Z23 Encounter for immunization: Secondary | ICD-10-CM

## 2022-11-29 DIAGNOSIS — E119 Type 2 diabetes mellitus without complications: Secondary | ICD-10-CM

## 2022-11-29 DIAGNOSIS — I10 Essential (primary) hypertension: Secondary | ICD-10-CM | POA: Diagnosis not present

## 2022-11-29 DIAGNOSIS — E785 Hyperlipidemia, unspecified: Secondary | ICD-10-CM | POA: Diagnosis not present

## 2022-11-29 DIAGNOSIS — E079 Disorder of thyroid, unspecified: Secondary | ICD-10-CM | POA: Diagnosis not present

## 2022-11-29 LAB — CBC WITH DIFFERENTIAL/PLATELET
Basophils Absolute: 0 10*3/uL (ref 0.0–0.1)
Basophils Relative: 0.6 % (ref 0.0–3.0)
Eosinophils Absolute: 0.1 10*3/uL (ref 0.0–0.7)
Eosinophils Relative: 1.6 % (ref 0.0–5.0)
HCT: 38.5 % (ref 36.0–46.0)
Hemoglobin: 11.9 g/dL — ABNORMAL LOW (ref 12.0–15.0)
Lymphocytes Relative: 38.2 % (ref 12.0–46.0)
Lymphs Abs: 2.9 10*3/uL (ref 0.7–4.0)
MCHC: 31 g/dL (ref 30.0–36.0)
MCV: 81.8 fL (ref 78.0–100.0)
Monocytes Absolute: 0.5 10*3/uL (ref 0.1–1.0)
Monocytes Relative: 7.2 % (ref 3.0–12.0)
Neutro Abs: 3.9 10*3/uL (ref 1.4–7.7)
Neutrophils Relative %: 52.4 % (ref 43.0–77.0)
Platelets: 309 10*3/uL (ref 150.0–400.0)
RBC: 4.71 Mil/uL (ref 3.87–5.11)
RDW: 15.5 % (ref 11.5–15.5)
WBC: 7.5 10*3/uL (ref 4.0–10.5)

## 2022-11-29 LAB — BASIC METABOLIC PANEL
BUN: 9 mg/dL (ref 6–23)
CO2: 30 meq/L (ref 19–32)
Calcium: 10 mg/dL (ref 8.4–10.5)
Chloride: 102 meq/L (ref 96–112)
Creatinine, Ser: 0.7 mg/dL (ref 0.40–1.20)
GFR: 89.55 mL/min (ref >60.00)
Glucose, Bld: 121 mg/dL — ABNORMAL HIGH (ref 70–99)
Potassium: 4.4 meq/L (ref 3.5–5.1)
Sodium: 140 meq/L (ref 135–145)

## 2022-11-29 LAB — ALT: ALT: 23 U/L (ref 0–35)

## 2022-11-29 LAB — TSH: TSH: 0.32 u[IU]/mL — ABNORMAL LOW (ref 0.35–5.50)

## 2022-11-29 LAB — AST: AST: 18 U/L (ref 0–37)

## 2022-11-29 LAB — HEMOGLOBIN A1C: Hgb A1c MFr Bld: 7.1 % — ABNORMAL HIGH (ref 4.6–6.5)

## 2022-11-29 NOTE — Patient Instructions (Addendum)
  Check the  blood pressure regularly Blood pressure goal:  between 110/65 and  135/85. If it is consistently higher or lower, let me know   Diabetes: You can check your sugars at different times, they right times to do it are: - early in AM fasting  ( blood sugar goal 70-130) - 2 hours after a meal (blood sugar goal less than 180)    GO TO THE LAB : Get the blood work     Next visit with me in 5 to 6 months, routine checkup.   Please schedule it at the front desk

## 2022-11-29 NOTE — Assessment & Plan Note (Signed)
DM: Doing great with lifestyle with healthy diet, ambulatory CBGs never more than 140.  Continue metformin, feet exam negative, checking labs. HTN, ambulatory BPs in the 120s, continue losartan, check a BMP High cholesterol:   will not try statins. Hypothyroidism: On Synthroid, good compliance, check TSH. Tinnitus: ENT ordered a carotid ultrasound: Negative (11-2022) Vaccines: had a flu shot, declined covid vax again RTC 5 to 6 months

## 2022-11-29 NOTE — Progress Notes (Signed)
Subjective:    Patient ID: Kimberly Combs, female    DOB: 1955/10/10, 66 y.o.   MRN: [id number]  DOS:  11/29/2022 Type of visit - description: f/u  Since the last visit she is doing well. Chronic medical problems addressed. Denies chest pain or difficulty breathing No nausea vomiting. No lower extremity paresthesias. Ambulatory BPs and CBGs WNL  Review of Systems See above   Past Medical History:  Diagnosis Date   Diabetes mellitus (HCC)    Hypertension    Thyroid disease     Past Surgical History:  Procedure Laterality Date   CESAREAN SECTION  4098,1191   X2   COLONOSCOPY  10 years ago    in [place]="normal exam" per pt    Current Outpatient Medications  Medication Instructions   aspirin EC 81 mg, Oral, Daily, Swallow whole.   b complex vitamins tablet 1 tablet, Oral, Daily   Blood Glucose Monitoring Suppl (ONETOUCH VERIO FLEX SYSTEM) w/Device KIT Check blood sugar 3 times daily   cyclobenzaprine (FLEXERIL) 10 mg, Oral, 3 times daily PRN   losartan (COZAAR) 50 mg, Oral, Daily   metFORMIN (GLUCOPHAGE-XR) 750 MG 24 hr tablet TAKE 2 TABLETS BY MOUTH EVERY MORNING AND 1 TABLET EVERY EVENING   ONE TOUCH LANCETS MISC Check blood sugar three times daily   ONETOUCH VERIO test strip CHECK BLOOD SUGAR THREE TIMES DAILY EVERY DAY   Synthroid 125 mcg, Oral, Daily before breakfast       Objective:   Physical Exam BP 126/82   Pulse 68   Temp 98.4 F (36.9 C) (Oral)   Resp 16   Ht 5\' 5"  (1.651 m)   Wt 186 lb 8 oz (84.6 kg)   SpO2 97%   BMI 31.04 kg/m  General:   Well developed, NAD, BMI noted. HEENT:  Normocephalic . Face symmetric, atraumatic Lungs:  CTA B Normal respiratory effort, no intercostal retractions, no accessory muscle use. Heart: RRR,  no murmur.  DM foot exam: No edema, good pedal pulses, pinprick examination normal Skin: Not pale. Not jaundice Neurologic:  alert & oriented X3.  Speech normal, gait appropriate for age and unassisted Psych--   Cognition and judgment appear intact.  Cooperative with normal attention span and concentration.  Behavior appropriate. No anxious or depressed appearing.      Assessment    Assessment (New patient 05/2017, used to see Dr. Nehemiah Settle) DM: dx ~ 2014 --Rybelsus: $$ --Jardiance: HA, nausea --Acarbose: ++ diarrrhea  HTN: dx ~ 2016 Hyperlipidemia: Declined statins (11-2020.  See OV April 5,2024) Hypothyroidism Anxiety, insomnia RLS, periodic limb movement per neuro 2019 MSK: Back pain, had MRI, DX lumbar stenosis (ortho OV 01-2019)  PLAN DM: Doing great with lifestyle with healthy diet, ambulatory CBGs never more than 140.  Continue metformin, feet exam negative, checking labs. HTN, ambulatory BPs in the 120s, continue losartan, check a BMP High cholesterol:   will not try statins. Hypothyroidism: On Synthroid, good compliance, check TSH. Tinnitus: ENT ordered a carotid ultrasound: Negative (11-2022) Vaccines: had a flu shot, declined covid vax again RTC 5 to 6 months

## 2022-12-06 MED ORDER — SYNTHROID 125 MCG PO TABS: 125.0000 ug | ORAL_TABLET | Freq: Every day | ORAL | 1 refills | Status: DC

## 2022-12-06 MED ORDER — METFORMIN HCL ER 750 MG PO TB24: ORAL_TABLET | ORAL | 1 refills | Status: AC

## 2022-12-06 MED ORDER — LOSARTAN POTASSIUM 50 MG PO TABS: 50.0000 mg | ORAL_TABLET | Freq: Every day | ORAL | 1 refills | Status: DC

## 2022-12-06 NOTE — Addendum Note (Signed)
Addended byConrad Hartwick D on: 12/06/2022 07:39 AM   Modules accepted: Orders

## 2023-02-21 ENCOUNTER — Other Ambulatory Visit: Payer: Self-pay | Admitting: Internal Medicine

## 2023-05-17 DIAGNOSIS — R9389 Abnormal findings on diagnostic imaging of other specified body structures: Secondary | ICD-10-CM | POA: Insufficient documentation

## 2023-05-18 ENCOUNTER — Ambulatory Visit: Payer: Medicare Other | Admitting: Internal Medicine

## 2023-05-18 ENCOUNTER — Encounter: Payer: Self-pay | Admitting: Internal Medicine

## 2023-05-18 VITALS — BP 130/74 | HR 63 | Temp 98.2°F | Resp 16 | Ht 65.0 in | Wt 185.2 lb

## 2023-05-18 DIAGNOSIS — Z7984 Long term (current) use of oral hypoglycemic drugs: Secondary | ICD-10-CM

## 2023-05-18 DIAGNOSIS — E119 Type 2 diabetes mellitus without complications: Secondary | ICD-10-CM | POA: Diagnosis not present

## 2023-05-18 DIAGNOSIS — I10 Essential (primary) hypertension: Secondary | ICD-10-CM

## 2023-05-18 DIAGNOSIS — D509 Iron deficiency anemia, unspecified: Secondary | ICD-10-CM

## 2023-05-18 DIAGNOSIS — Z23 Encounter for immunization: Secondary | ICD-10-CM

## 2023-05-18 DIAGNOSIS — Z09 Encounter for follow-up examination after completed treatment for conditions other than malignant neoplasm: Secondary | ICD-10-CM

## 2023-05-18 DIAGNOSIS — E785 Hyperlipidemia, unspecified: Secondary | ICD-10-CM

## 2023-05-18 LAB — HEMOGLOBIN A1C: Hgb A1c MFr Bld: 7.5 % — ABNORMAL HIGH (ref 4.6–6.5)

## 2023-05-18 LAB — CBC WITH DIFFERENTIAL/PLATELET
Basophils Absolute: 0 10*3/uL (ref 0.0–0.1)
Basophils Relative: 0.7 % (ref 0.0–3.0)
Eosinophils Absolute: 0.2 10*3/uL (ref 0.0–0.7)
Eosinophils Relative: 2.4 % (ref 0.0–5.0)
HCT: 39 % (ref 36.0–46.0)
Hemoglobin: 12.5 g/dL (ref 12.0–15.0)
Lymphocytes Relative: 43.2 % (ref 12.0–46.0)
Lymphs Abs: 2.8 10*3/uL (ref 0.7–4.0)
MCHC: 32.1 g/dL (ref 30.0–36.0)
MCV: 81.8 fl (ref 78.0–100.0)
Monocytes Absolute: 0.5 10*3/uL (ref 0.1–1.0)
Monocytes Relative: 7.3 % (ref 3.0–12.0)
Neutro Abs: 3 10*3/uL (ref 1.4–7.7)
Neutrophils Relative %: 46.4 % (ref 43.0–77.0)
Platelets: 306 10*3/uL (ref 150.0–400.0)
RBC: 4.76 Mil/uL (ref 3.87–5.11)
RDW: 15.5 % (ref 11.5–15.5)
WBC: 6.5 10*3/uL (ref 4.0–10.5)

## 2023-05-18 LAB — COMPREHENSIVE METABOLIC PANEL WITH GFR
ALT: 24 U/L (ref 0–35)
AST: 24 U/L (ref 0–37)
Albumin: 4.7 g/dL (ref 3.5–5.2)
Alkaline Phosphatase: 84 U/L (ref 39–117)
BUN: 14 mg/dL (ref 6–23)
CO2: 28 meq/L (ref 19–32)
Calcium: 9.8 mg/dL (ref 8.4–10.5)
Chloride: 101 meq/L (ref 96–112)
Creatinine, Ser: 0.84 mg/dL (ref 0.40–1.20)
GFR: 71.72 mL/min (ref >60.00)
Glucose, Bld: 126 mg/dL — ABNORMAL HIGH (ref 70–99)
Potassium: 4.5 meq/L (ref 3.5–5.1)
Sodium: 138 meq/L (ref 135–145)
Total Bilirubin: 0.6 mg/dL (ref 0.2–1.2)
Total Protein: 7.8 g/dL (ref 6.0–8.3)

## 2023-05-18 LAB — MICROALBUMIN / CREATININE URINE RATIO
Creatinine,U: 28.5 mg/dL
Microalb Creat Ratio: 39.3 mg/g — ABNORMAL HIGH (ref 0.0–30.0)
Microalb, Ur: 1.1 mg/dL (ref 0.0–1.9)

## 2023-05-18 LAB — TSH: TSH: 1.34 u[IU]/mL (ref 0.35–5.50)

## 2023-05-18 LAB — LIPID PANEL
Cholesterol: 211 mg/dL — ABNORMAL HIGH (ref 0–200)
HDL: 71.9 mg/dL (ref >39.00)
LDL Cholesterol: 107 mg/dL — ABNORMAL HIGH (ref 0–99)
NonHDL: 138.63
Total CHOL/HDL Ratio: 3
Triglycerides: 160 mg/dL — ABNORMAL HIGH (ref 0.0–149.0)
VLDL: 32 mg/dL (ref 0.0–40.0)

## 2023-05-18 LAB — FERRITIN: Ferritin: 32.8 ng/mL (ref 10.0–291.0)

## 2023-05-18 LAB — IRON: Iron: 114 ug/dL (ref 42–145)

## 2023-05-18 NOTE — Patient Instructions (Addendum)
 INSTRUCTIONS  FOR TODAY Vaccines I recommend: Shingrix (shingles) COVID booster if not done in the last 8 months Flu shot every fall  Check the  blood pressure regularly Blood pressure goal:  between 110/65 and  135/85. If it is consistently higher or lower, let me know   Diabetes: Check your blood sugars: - early in AM fasting  ( blood sugar goal 70-130) - 2 hours after a meal (blood sugar goal less than 180)      GO TO THE LAB : Get the blood work     Next office visit for a checkup in 4 months Please make an appointment before you leave today Depending on your blood or XRs results it might be necessary to come back sooner      "Health Care Power of attorney" (Also know as a  "Living will" or  Advance care planning documents)  If you already have a living will or healthcare power of attorney, is recommended you bring the copy to be scanned in your chart.   The document will be available to all the doctors you see in the system.  If you are over 50 y/o and don't have the document, please read:  Advance care planning is a process that supports adults in  understanding and sharing their preferences regarding future medical care.  The patient's preferences are recorded in documents called Advance Directives and the can be modified at any time while the patient is in full mental capacity.     More information at: [email or web address]

## 2023-05-18 NOTE — Progress Notes (Signed)
 Subjective:    Patient ID: Kimberly Combs, female    DOB: 1955-11-01, 68 y.o.   MRN: [id number]  DOS:  05/18/2023 Type of visit - description: Follow-up  Routine follow-up, chronic medical problems addressed. Feels very well. Trying to eat healthy.  Denies chest pain or difficulty breathing. No nausea vomiting. No cough.   Review of Systems See above   Past Medical History:  Diagnosis Date   Diabetes mellitus (HCC)    Hypertension    Thyroid  disease     Past Surgical History:  Procedure Laterality Date   CESAREAN SECTION  [phone]   X2   COLONOSCOPY  10 years ago    in [place]="normal exam" per pt    Current Outpatient Medications  Medication Instructions   aspirin EC 81 mg, Daily   b complex vitamins tablet 1 tablet, Daily   Blood Glucose Monitoring Suppl (ONETOUCH VERIO FLEX SYSTEM) w/Device KIT Check blood sugar 3 times daily   cyclobenzaprine  (FLEXERIL ) 10 mg, Oral, 3 times daily PRN   losartan  (COZAAR ) 50 mg, Oral, Daily   metFORMIN  (GLUCOPHAGE -XR) 750 MG 24 hr tablet TAKE 2 TABLETS BY MOUTH EVERY MORNING AND 1 TABLET EVERY EVENING   ONE TOUCH LANCETS MISC Check blood sugar three times daily   ONETOUCH VERIO test strip CHECK BLOOD SUGAR THREE TIMES DAILY EVERY DAY   Synthroid  125 mcg, Oral, Daily before breakfast       Objective:   Physical Exam BP 130/74   Pulse 63   Temp 98.2 F (36.8 C) (Oral)   Resp 16   Ht 5\' 5"  (1.651 m)   Wt 185 lb 4 oz (84 kg)   SpO2 97%   BMI 30.83 kg/m  General: Well developed, NAD, BMI noted Neck: No  thyromegaly  HEENT:  Normocephalic . Face symmetric, atraumatic Lungs:  CTA B Normal respiratory effort, no intercostal retractions, no accessory muscle use. Heart: RRR,  no murmur.  Abdomen:  Not distended, soft, non-tender. No rebound or rigidity.   Lower extremities: no pretibial edema bilaterally  Skin: Exposed areas without rash. Not pale. Not jaundice Neurologic:  alert & oriented X3.  Speech normal, gait  appropriate for age and unassisted Strength symmetric and appropriate for age.  Psych: Cognition and judgment appear intact.  Cooperative with normal attention span and concentration.  Behavior appropriate. No anxious or depressed appearing.     Assessment    Problem list (New patient 05/2017) DM: dx ~ 2014 --Rybelsus : $$ --Jardiance : HA, nausea --Acarbose : ++ diarrrhea  HTN: dx ~ 2016 Hyperlipidemia: Declined statins (11-2020.  See OV April 5,2024) Hypothyroidism Anxiety, insomnia RLS, periodic limb movement per neuro 2019 MSK: Back pain, had MRI, DX lumbar stenosis (ortho OV 01-2019)  PLAN DM: Currently on metformin .  Doing very well with diet and exercise, she is somewhat frustrated because her blood sugars are not dropping as she would like to, typically in the 130s. Recommend to continue healthy lifestyle, recheck A1c.  Further advised for results. HTN: Ambulatory BPs in the 130s, continue losartan , checking CMP. Mild anemia: Per chart review, checking CBC, iron, ferritin. Hyperlipidemia: Has declined statins consistently. Hypothyroidism: Last TSH is slightly suppressed, rechecking today, continue current dose of Synthroid , further advised with TSH results. Preventive care reviewed. Td 2017 PNM 20 today -Vaccines I recommend:   Shingrix ,   COVID booster if not done recently, flu shot every fall. CCS: Cscope ( [place] ) ~ 2009, s/p colonoscopy in [facility] 07/04/2017,Tubular adenoma, C-scope 07/21/2021, next 10 years per  colonoscopy  report Female care:  sees gyn, [facility] 08/2022. PAP 08-05-2021  per [facility] DEXA: 2016,   2021 (wnl), next 2026 ACP info provided   RTC 4 months

## 2023-05-19 ENCOUNTER — Telehealth: Payer: Self-pay

## 2023-05-19 MED ORDER — GLIPIZIDE ER 5 MG PO TB24: 5.0000 mg | ORAL_TABLET | Freq: Every day | ORAL | 1 refills | Status: DC

## 2023-05-19 NOTE — Telephone Encounter (Signed)
 Results mailed

## 2023-05-19 NOTE — Telephone Encounter (Signed)
 Copied from CRM [id number]. Topic: Clinical - Lab/Test Results >> May 19, 2023  3:58 PM Kimberly Combs wrote: Reason for CRM: Patient is returning a call, reviewed chart and advised patient of the Lab results and recommendations Dr.Paz left. Patient understood and had no questions. She would like for the results to be mailed to her home as well.

## 2023-05-19 NOTE — Assessment & Plan Note (Addendum)
 DM: Currently on metformin .  Doing very well with diet and exercise, she is somewhat frustrated because her blood sugars are not dropping as she would like to, typically in the 130s. Recommend to continue healthy lifestyle, recheck A1c.  Further advised for results. HTN: Ambulatory BPs in the 130s, continue losartan , checking CMP. Mild anemia: Per chart review, checking CBC, iron, ferritin. Hyperlipidemia: Has declined statins consistently. Hypothyroidism: Last TSH is slightly suppressed, rechecking today, continue current dose of Synthroid , further advised with TSH results. Preventive care reviewed  RTC 4 months

## 2023-05-19 NOTE — Assessment & Plan Note (Addendum)
 Preventive care reviewed. Td 2017 PNM 20 today -Vaccines I recommend:   Shingrix,   COVID booster if not done recently, flu shot every fall. CCS: Cscope ( [place] ) ~ 2009, s/p colonoscopy in [facility] 07/04/2017,Tubular adenoma, C-scope 07/21/2021, next 10 years per  colonoscopy report Female care:  sees gyn, [facility] 08/2022. PAP 08-05-2021  per [facility] DEXA: 2016,   2021 (wnl), next 2026 ACP info provided

## 2023-05-20 ENCOUNTER — Encounter: Payer: Self-pay | Admitting: Internal Medicine

## 2023-05-21 ENCOUNTER — Other Ambulatory Visit: Payer: Self-pay | Admitting: Internal Medicine

## 2023-06-28 LAB — HM DIABETES EYE EXAM

## 2023-08-10 ENCOUNTER — Ambulatory Visit

## 2023-08-19 ENCOUNTER — Other Ambulatory Visit: Payer: Self-pay | Admitting: Internal Medicine

## 2023-08-19 DIAGNOSIS — Z1231 Encounter for screening mammogram for malignant neoplasm of breast: Secondary | ICD-10-CM

## 2023-08-24 ENCOUNTER — Encounter: Payer: Self-pay | Admitting: Internal Medicine

## 2023-09-07 ENCOUNTER — Other Ambulatory Visit (HOSPITAL_BASED_OUTPATIENT_CLINIC_OR_DEPARTMENT_OTHER): Payer: Self-pay

## 2023-09-07 ENCOUNTER — Telehealth: Payer: Self-pay | Admitting: *Deleted

## 2023-09-07 ENCOUNTER — Ambulatory Visit (INDEPENDENT_AMBULATORY_CARE_PROVIDER_SITE_OTHER): Admitting: *Deleted

## 2023-09-07 VITALS — BP 166/61 | HR 66 | Temp 98.4°F | Resp 18 | Ht 65.0 in | Wt 186.6 lb

## 2023-09-07 DIAGNOSIS — Z0000 Encounter for general adult medical examination without abnormal findings: Secondary | ICD-10-CM | POA: Diagnosis not present

## 2023-09-07 MED ORDER — ZOSTER VAC RECOMB ADJUVANTED 50 MCG/0.5ML IM SUSR
0.5000 mL | Freq: Once | INTRAMUSCULAR | 1 refills | Status: AC
  Filled 2023-09-07: qty 0.5, 1d supply, fill #0

## 2023-09-07 NOTE — Telephone Encounter (Signed)
 PT had AWV today.  BP readings were: 160/75, 166/61. She reports compliance with medication and home readings 128-140/70s. Pt has follow up with PCP on 8/12.

## 2023-09-07 NOTE — Telephone Encounter (Signed)
 Noted

## 2023-09-07 NOTE — Patient Instructions (Signed)
 Kimberly Combs , Thank you for taking time out of your busy schedule to complete your Annual Wellness Visit with me. I enjoyed our conversation and look forward to speaking with you again next year. I, as well as your care team,  appreciate your ongoing commitment to your health goals. Please review the following plan we discussed and let me know if I can assist you in the future. Your Game plan/ To Do List    Follow up Visits: Next Medicare AWV with our clinical staff:   09/07/24 9:40am  Next Office Visit with your provider: 09/13/23  9:20am  Clinician Recommendations:  Aim for 30 minutes of exercise or brisk walking, 6-8 glasses of water, and 5 servings of fruits and vegetables each day.       This is a list of the screening recommended for you and due dates:  Health Maintenance  Topic Date Due   Zoster (Shingles) Vaccine (1 of 2) Never done   Medicare Annual Wellness Visit  08/31/2023   Flu Shot  09/02/2023   Hemoglobin A1C  11/17/2023   Complete foot exam   11/29/2023   Yearly kidney function blood test for diabetes  05/17/2024   Yearly kidney health urinalysis for diabetes  05/17/2024   Eye exam for diabetics  06/27/2024   Colon Cancer Screening  07/21/2024   Mammogram  08/16/2024   DTaP/Tdap/Td vaccine (2 - Td or Tdap) 10/06/2025   Pneumococcal Vaccine for age over 59  Completed   DEXA scan (bone density measurement)  Completed   Hepatitis C Screening  Completed   Hepatitis B Vaccine  Aged Out   HPV Vaccine  Aged Out   Meningitis B Vaccine  Aged Out   COVID-19 Vaccine  Discontinued    Advanced directives: (Copy Requested) Please bring a copy of your health care power of attorney and living will to the office to be added to your chart at your convenience. You can mail to [facility] [address] [place], [place] [address] or email to [email or web address] Advance Care Planning is important because it:  [x]  Makes sure you receive the medical care that is  consistent with your values, goals, and preferences  [x]  It provides guidance to your family and loved ones and reduces their decisional burden about whether or not they are making the right decisions based on your wishes.  Follow the link provided in your after visit summary or read over the paperwork we have mailed to you to help you started getting your Advance Directives in place. If you need assistance in completing these, please reach out to us  so that we can help you!  See attachments for Preventive Care and Fall Prevention Tips.

## 2023-09-07 NOTE — Progress Notes (Signed)
 Subjective:   Kimberly Combs is a 68 y.o. who presents for a Medicare Wellness preventive visit.  As a reminder, Annual Wellness Visits don't include a physical exam, and some assessments may be limited, especially if this visit is performed virtually. We may recommend an in-person follow-up visit with your provider if needed.  Visit Complete: In person  Persons Participating in Visit: Patient.  AWV Questionnaire: No: Patient Medicare AWV questionnaire was not completed prior to this visit.  Cardiac Risk Factors include: advanced age (>13men, >26 women);diabetes mellitus;dyslipidemia;hypertension     Objective:    Today's Vitals   09/07/23 0940 09/07/23 1010  BP: (!) 160/75 (!) 166/61  Pulse: 66   Resp: 18   Temp: 98.4 F (36.9 C)   TempSrc: Oral   SpO2: 99%   Weight: 186 lb 9.6 oz (84.6 kg)   Height: 5' 5 (1.651 m)    Body mass index is 31.05 kg/m.     09/07/2023    9:57 AM 08/31/2022   10:23 AM 04/07/2018    9:47 AM 01/26/2015   11:06 AM  Advanced Directives  Does Patient Have a Medical Advance Directive? No No No  No   Would patient like information on creating a medical advance directive? No - Patient declined No - Patient declined No - Patient declined  No - patient declined information      Data saved with a previous flowsheet row definition    Current Medications (verified) Outpatient Encounter Medications as of 09/07/2023  Medication Sig   aspirin EC 81 MG tablet Take 81 mg by mouth daily. Swallow whole.   b complex vitamins tablet Take 1 tablet by mouth daily.   Blood Glucose Monitoring Suppl (ONETOUCH VERIO FLEX SYSTEM) w/Device KIT Check blood sugar 3 times daily   glipiZIDE  (GLIPIZIDE  XL) 5 MG 24 hr tablet Take 1 tablet (5 mg total) by mouth daily with breakfast.   losartan  (COZAAR ) 50 MG tablet Take 1 tablet (50 mg total) by mouth daily.   metFORMIN  (GLUCOPHAGE -XR) 750 MG 24 hr tablet TAKE 2 TABLETS BY MOUTH EVERY MORNING AND 1 TABLET EVERY EVENING   ONE  TOUCH LANCETS MISC Check blood sugar three times daily   ONETOUCH VERIO test strip CHECK BLOOD SUGAR THREE TIMES DAILY EVERY DAY   SYNTHROID  125 MCG tablet Take 1 tablet (125 mcg total) by mouth daily before breakfast.   No facility-administered encounter medications on file as of 09/07/2023.    Allergies (verified) Acarbose  and Jardiance  [empagliflozin ]   History: Past Medical History:  Diagnosis Date   Diabetes mellitus (HCC)    Hypertension    Thyroid  disease    Past Surgical History:  Procedure Laterality Date   CESAREAN SECTION  [phone]   X2   COLONOSCOPY  10 years ago    in [place]=normal exam per pt   Family History  Problem Relation Age of Onset   Arthritis Mother    Diabetes Father    CAD Neg Hx    Colon cancer Neg Hx    Breast cancer Neg Hx    Esophageal cancer Neg Hx    Stomach cancer Neg Hx    Rectal cancer Neg Hx    Social History   Socioeconomic History   Marital status: Married    Spouse name: Ray   Number of children: 2   Years of education: Not on file   Highest education level: Bachelor's degree (e.g., BA, AB, BS)  Occupational History   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 She lives with her husband in a 2 story house. She drinks one cup of deca coffee most days. She and her husband walk occasionally.   Social Drivers of Corporate investment banker Strain: Low Risk  (09/07/2023)   Overall Financial Resource Strain (CARDIA)    Difficulty of  Paying Living Expenses: Not hard at all  Food Insecurity: No Food Insecurity (09/07/2023)   Hunger Vital Sign    Worried About Running Out of Food in the Last Year: Never true    Ran Out of Food in the Last Year: Never true  Transportation Needs: No Transportation Needs (09/07/2023)   PRAPARE - Administrator, Civil Service (Medical): No    Lack of Transportation (Non-Medical): No  Physical Activity: Sufficiently Active (09/07/2023)   Exercise Vital Sign    Days of Exercise per Week: 3 days    Minutes of Exercise per Session: 60 min  Stress: No Stress Concern Present (09/07/2023)   [employer] of Occupational Health - Occupational Stress Questionnaire    Feeling of Stress: Not at all  Social Connections: Moderately Integrated (09/07/2023)   Social Connection and Isolation Panel    Frequency of Communication with Friends and Family: More than three times a week    Frequency of Social Gatherings with Friends and Family: Never    Attends Religious Services: More than 4 times per year    Active Member of [employer] or Organizations: No    Attends Engineer, structural: Never    Marital Status: Married    Tobacco Counseling Counseling given: Not Answered Tobacco comments: light smoker, quit ~ 2000    Clinical Intake:  Pre-visit preparation completed: Yes  Pain : No/denies pain     BMI - recorded: 31.05 Nutritional Status: BMI > 30  Obese Nutritional Risks: None Diabetes: Yes CBG done?: No Did pt. bring in CBG monitor from home?: No  Lab Results  Component Value Date   HGBA1C 7.5 (H) 05/18/2023   HGBA1C 7.1 (H) 11/29/2022   HGBA1C 6.8 (H) 05/07/2022     How often do you need to have someone help you when you read instructions, pamphlets, or other written materials from your doctor or pharmacy?: 1 - Never What is the last grade level you completed in school?: Bachelor's degree  Interpreter Needed?: No  Information entered by :: Lolita Libra,  CMA   Activities of Daily Living     09/07/2023   10:21 AM  In your present state of health, do you have any difficulty performing the following activities:  Hearing? 0  Vision? 0  Difficulty concentrating or making decisions? 0  Walking or climbing stairs? 0  Dressing or bathing? 0  Doing errands, shopping? 0  Preparing Food and eating ? N  Using the Toilet? N  In the  past six months, have you accidently leaked urine? N  Do you have problems with loss of bowel control? N  Managing your Medications? N  Managing your Finances? N  Housekeeping or managing your Housekeeping? N    Patient Care Team: Amon Aloysius BRAVO, MD as PCP - General (Internal Medicine) Marilynn Nest, DO as Consulting Physician (Obstetrics and Gynecology) Trudy Righter, DC as Referring Physician (Chiropractic Medicine) [facility], [facility] (Ophthalmology)  I have updated your Care Teams any recent Medical Services you may have received from other providers in the past year.     Assessment:   This is a routine wellness examination for [place].  Hearing/Vision screen Hearing Screening - Comments:: Denies hearing difficulties.  Vision Screening - Comments:: Wears RX glasses -- up to date with routine eye exams.    Goals Addressed   None    Depression Screen     09/07/2023    9:51 AM 11/29/2022   10:09 AM 08/31/2022   10:21 AM 05/07/2022    9:40 AM 11/26/2021    9:47 AM 08/12/2021   10:22 AM 11/14/2020    9:35 AM  PHQ 2/9 Scores  PHQ - 2 Score 0 0 0 0 0 0 0  PHQ- 9 Score 0          Fall Risk     09/07/2023    9:47 AM 05/18/2023    9:46 AM 11/29/2022   10:09 AM 08/31/2022   10:19 AM 05/07/2022    9:40 AM  Fall Risk   Falls in the past year? 0 0 0 0 0  Number falls in past yr: 0 0 0 0 0  Injury with Fall? 0 0 0 0 0  Risk for fall due to : No Fall Risks   No Fall Risks   Follow up Education provided Falls evaluation completed;Education provided Falls evaluation completed Falls evaluation  completed Falls evaluation completed    MEDICARE RISK AT HOME:  Medicare Risk at Home Any stairs in or around the home?: Yes If so, are there any without handrails?: No Home free of loose throw rugs in walkways, pet beds, electrical cords, etc?: Yes Adequate lighting in your home to reduce risk of falls?: Yes Life alert?: No Use of a cane, walker or w/c?: Yes Grab bars in the bathroom?: No Shower chair or bench in shower?: No Elevated toilet seat or a handicapped toilet?: No  TIMED UP AND GO:  Was the test performed?  No  Cognitive Function: 6CIT completed        09/07/2023    9:56 AM 08/31/2022   10:23 AM  6CIT Screen  What Year? 0 points 0 points  What month? 0 points 0 points  What time? 0 points 3 points  Count back from 20 0 points 0 points  Months in reverse 0 points 0 points  Repeat phrase 0 points 0 points  Total Score 0 points 3 points    Immunizations Immunization History  Administered Date(s) Administered   Fluad Quad(high Dose 65+) 11/14/2020, 11/26/2021   Fluad Trivalent(High Dose 65+) 11/29/2022   Influenza,inj,Quad PF,6+ Mos 11/02/2017, 12/07/2018, 11/19/2019   Moderna SARS-COV2 Booster Vaccination 06/03/2020   Moderna Sars-Covid-2 Vaccination 06/15/2019, 07/16/2019, 02/15/2020   PNEUMOCOCCAL CONJUGATE-20 05/18/2023   Pneumococcal Polysaccharide-23 09/24/2014, 11/02/2017   Tdap 10/07/2015   Zoster Recombinant(Shingrix ) 09/07/2023    Screening Tests Health Maintenance  Topic Date Due   INFLUENZA VACCINE  09/02/2023   Zoster Vaccines- Shingrix  (2 of 2) 11/02/2023  HEMOGLOBIN A1C  11/17/2023   FOOT EXAM  11/29/2023   Diabetic kidney evaluation - eGFR measurement  05/17/2024   Diabetic kidney evaluation - Urine ACR  05/17/2024   OPHTHALMOLOGY EXAM  06/27/2024   Colonoscopy  07/21/2024   MAMMOGRAM  08/16/2024   Medicare Annual Wellness (AWV)  09/06/2024   DTaP/Tdap/Td (2 - Td or Tdap) 10/06/2025   Pneumococcal Vaccine: 50+ Years  Completed    DEXA SCAN  Completed   Hepatitis C Screening  Completed   Hepatitis B Vaccines  Aged Out   HPV VACCINES  Aged Out   Meningococcal B Vaccine  Aged Out   COVID-19 Vaccine  Discontinued    Health Maintenance  Health Maintenance Due  Topic Date Due   INFLUENZA VACCINE  09/02/2023   Health Maintenance Items Addressed:    Additional Screening:  Vision Screening: Recommended annual ophthalmology exams for early detection of glaucoma and other disorders of the eye. Would you like a referral to an eye doctor? No    Dental Screening: Recommended annual dental exams for proper oral hygiene  Community Resource Referral / Chronic Care Management: CRR required this visit?  No   CCM required this visit?  No   Plan:    I have personally reviewed and noted the following in the patient's chart:   Medical and social history Use of alcohol, tobacco or illicit drugs  Current medications and supplements including opioid prescriptions. Patient is not currently taking opioid prescriptions. Functional ability and status Nutritional status Physical activity Advanced directives List of other physicians Hospitalizations, surgeries, and ER visits in previous 12 months Vitals Screenings to include cognitive, depression, and falls Referrals and appointments  In addition, I have reviewed and discussed with patient certain preventive protocols, quality metrics, and best practice recommendations. A written personalized care plan for preventive services as well as general preventive health recommendations were provided to patient.   Lolita Libra, CMA   09/07/2023   After Visit Summary: (In Person-Printed) AVS printed and given to the patient  Notes: Nothing significant to report at this time.

## 2023-09-13 ENCOUNTER — Ambulatory Visit: Admitting: Internal Medicine

## 2023-09-13 ENCOUNTER — Encounter: Payer: Self-pay | Admitting: Internal Medicine

## 2023-09-20 ENCOUNTER — Ambulatory Visit
Admission: RE | Admit: 2023-09-20 | Discharge: 2023-09-20 | Disposition: A | Discharge status: Home / Self Care | Source: Ambulatory Visit | Attending: Internal Medicine | Admitting: Internal Medicine

## 2023-09-20 DIAGNOSIS — Z1231 Encounter for screening mammogram for malignant neoplasm of breast: Secondary | ICD-10-CM

## 2023-09-26 ENCOUNTER — Encounter: Payer: Self-pay | Admitting: Internal Medicine

## 2023-09-26 ENCOUNTER — Ambulatory Visit (INDEPENDENT_AMBULATORY_CARE_PROVIDER_SITE_OTHER): Admitting: Internal Medicine

## 2023-09-26 VITALS — BP 146/82 | HR 73 | Temp 98.2°F | Resp 16 | Ht 65.0 in | Wt 187.1 lb

## 2023-09-26 DIAGNOSIS — Z5320 Procedure and treatment not carried out because of patient's decision for unspecified reasons: Secondary | ICD-10-CM

## 2023-09-26 DIAGNOSIS — I10 Essential (primary) hypertension: Secondary | ICD-10-CM

## 2023-09-26 DIAGNOSIS — E119 Type 2 diabetes mellitus without complications: Secondary | ICD-10-CM

## 2023-09-26 DIAGNOSIS — Z7984 Long term (current) use of oral hypoglycemic drugs: Secondary | ICD-10-CM

## 2023-09-26 DIAGNOSIS — E785 Hyperlipidemia, unspecified: Secondary | ICD-10-CM | POA: Diagnosis not present

## 2023-09-26 DIAGNOSIS — E039 Hypothyroidism, unspecified: Secondary | ICD-10-CM | POA: Diagnosis not present

## 2023-09-26 LAB — BASIC METABOLIC PANEL WITH GFR
BUN: 13 mg/dL (ref 6–23)
CO2: 28 meq/L (ref 19–32)
Calcium: 9.7 mg/dL (ref 8.4–10.5)
Chloride: 104 meq/L (ref 96–112)
Creatinine, Ser: 0.77 mg/dL (ref 0.40–1.20)
GFR: 79.41 mL/min (ref >60.00)
Glucose, Bld: 128 mg/dL — ABNORMAL HIGH (ref 70–99)
Potassium: 4.3 meq/L (ref 3.5–5.1)
Sodium: 142 meq/L (ref 135–145)

## 2023-09-26 LAB — MICROALBUMIN / CREATININE URINE RATIO
Creatinine,U: 19 mg/dL
Microalb Creat Ratio: 51 mg/g — ABNORMAL HIGH (ref 0.0–30.0)
Microalb, Ur: 1 mg/dL (ref 0.0–1.9)

## 2023-09-26 LAB — TSH: TSH: 1.73 u[IU]/mL (ref 0.35–5.50)

## 2023-09-26 LAB — HEMOGLOBIN A1C: Hgb A1c MFr Bld: 7.8 % — ABNORMAL HIGH (ref 4.6–6.5)

## 2023-09-26 MED ORDER — LOSARTAN POTASSIUM 100 MG PO TABS: 100.0000 mg | ORAL_TABLET | Freq: Every day | ORAL | 1 refills | Status: AC

## 2023-09-26 MED ORDER — AMLODIPINE BESYLATE 2.5 MG PO TABS: 2.5000 mg | ORAL_TABLET | Freq: Every day | ORAL | 1 refills | Status: DC

## 2023-09-26 NOTE — Patient Instructions (Signed)
 Vaccines I recommend: Flu shot this fall A COVID booster  Continue with losartan  100 mg: Once a day Add amlodipine  2.5 mg: 1 tablet at nighttime.   Continue checking your blood pressure regularly.  If it is slightly elevated, rest for 10 additional minutes, drink a glass of water and check again. Blood pressure goal:  between 110/65 and  135/85. If it is consistently higher or lower, let me know     GO TO THE LAB :  Get the blood work   Your results will be posted on MyChart with my comments  Go to the front desk for the checkout Please make an appointment for a checkup in 3 months    STOP BY THE FIRST FLOOR:  get the XR

## 2023-09-26 NOTE — Progress Notes (Signed)
 Subjective:    Patient ID: Kimberly Combs, female    DOB: 1955/12/12, 68 y.o.   MRN: [id number]  DOS:  09/26/2023 Type of visit - description: Routine checkup  Patient checks BPs regularly, noted BPs to be high for the first time earlier this month. Today BP is in the 150s. Reports healthy diet with no excessive salt.  Not taking NSAIDs. Denies any headache, chest pain.  No shortness of breath edema or palpitations.  DM: Good compliance, denies symptoms of hypoglycemia.   BP Readings from Last 3 Encounters:  09/26/23 (!) 152/70  09/07/23 (!) 166/61  05/18/23 130/74   Review of Systems See above   Past Medical History:  Diagnosis Date   Diabetes mellitus (HCC)    Hypertension    Thyroid  disease     Past Surgical History:  Procedure Laterality Date   CESAREAN SECTION  [phone]   X2   COLONOSCOPY  10 years ago    in [place]=normal exam per pt    Current Outpatient Medications  Medication Instructions   aspirin EC 81 mg, Daily   b complex vitamins tablet 1 tablet, Daily   Blood Glucose Monitoring Suppl (ONETOUCH VERIO FLEX SYSTEM) w/Device KIT Check blood sugar 3 times daily   glipiZIDE  (GLIPIZIDE  XL) 5 mg, Oral, Daily with breakfast   losartan  (COZAAR ) 50 mg, Oral, Daily   metFORMIN  (GLUCOPHAGE -XR) 750 MG 24 hr tablet TAKE 2 TABLETS BY MOUTH EVERY MORNING AND 1 TABLET EVERY EVENING   ONE TOUCH LANCETS MISC Check blood sugar three times daily   ONETOUCH VERIO test strip CHECK BLOOD SUGAR THREE TIMES DAILY EVERY DAY   Synthroid  125 mcg, Oral, Daily before breakfast       Objective:   Physical Exam BP (!) 152/70   Pulse 73   Temp 98.2 F (36.8 C) (Oral)   Resp 16   Ht 5' 5 (1.651 m)   Wt 187 lb 2 oz (84.9 kg)   SpO2 99%   BMI 31.14 kg/m  General:   Well developed, NAD, BMI noted.  HEENT:  Normocephalic . Face symmetric, atraumatic Lungs:  CTA B Normal respiratory effort, no intercostal retractions, no accessory muscle use. Heart: RRR,  no murmur.   Abdomen:  Not distended, soft, non-tender. No bruit   DM foot exam: No edema, good pedal pulses, pinprick examination normal Neurologic:  alert & oriented X3.  Speech normal, gait appropriate for age and unassisted Psych--  Cognition and judgment appear intact.  Cooperative with normal attention span and concentration.  Behavior appropriate. No anxious or depressed appearing.     Assessment    Problem list (New patient 05/2017) DM: dx ~ 2014 --Rybelsus : $$ --Jardiance : HA, nausea --Acarbose : ++ diarrrhea  HTN: dx ~ 2016 Hyperlipidemia: Declined statins (11-2020.  See OV April 5,2024) Hypothyroidism Anxiety, insomnia RLS, periodic limb movement per neuro 2019 MSK: Back pain, had MRI, DX lumbar stenosis (ortho OV 01-2019)  PLAN DM: Last A1c increased to 7.5  on metformin  we added glipizide .  (For a variety of reasons she can't  take Rybelsus , Jardiance , carbose). Reports a very healthy lifestyle, ambulatory CBGs ranged from 97-150, no symptoms of low sugar. Feet exam negative Plan: A1c, micro. HTN: Previously well-controlled on losartan  50 mg, BPs have been consistently elevated for the last 3 to 4 weeks, see review of systems, unclear etiology.  BP today 146/82.  She self increase losartan  to 100 mg a week ago. EKG today sinus rhythm, no acute changes.  No previous EKGs. Plan:  Continue losartan  100 mg, add amlodipine  2.5 mg, watch for edema.  BMP. Dyslipidemia: Last LDL 107, has consistently declined statins Hypothyroidism: Last TSH well-balanced, recheck today.  On Synthroid  Preventive care:  Had Shingrix  No. 1, plans to have the second Recommend a flu and a COVID booster this fall. RTC 3 months.  Sooner if needed

## 2023-09-26 NOTE — Assessment & Plan Note (Signed)
 DM: Last A1c increased to 7.5  on metformin  we added glipizide .  (For a variety of reasons she can't  take Rybelsus , Jardiance , carbose). Reports a very healthy lifestyle, ambulatory CBGs ranged from 97-150, no symptoms of low sugar. Feet exam negative Plan: A1c, micro. HTN: Previously well-controlled on losartan  50 mg, BPs have been consistently elevated for the last 3 to 4 weeks, see review of systems, unclear etiology.  BP today 146/82.  She self increase losartan  to 100 mg a week ago. EKG today sinus rhythm, no acute changes.  No previous EKGs. Plan: Continue losartan  100 mg, add amlodipine  2.5 mg, watch for edema.  BMP. Dyslipidemia: Last LDL 107, has consistently declined statins Hypothyroidism: Last TSH well-balanced, recheck today.  On Synthroid  Preventive care:  Had Shingrix  No. 1, plans to have the second Recommend a flu and a COVID booster this fall. RTC 3 months.  Sooner if needed

## 2023-09-28 ENCOUNTER — Ambulatory Visit: Payer: Self-pay | Admitting: Internal Medicine

## 2023-09-28 MED ORDER — GLIPIZIDE ER 10 MG PO TB24
10.0000 mg | ORAL_TABLET | Freq: Every day | ORAL | 1 refills | Status: DC
Start: 2023-09-28 — End: 2023-11-28

## 2023-09-28 NOTE — Addendum Note (Signed)
 Addended by: Nash Bolls D on: 09/28/2023 08:50 AM   Modules accepted: Orders

## 2023-11-16 ENCOUNTER — Other Ambulatory Visit (HOSPITAL_BASED_OUTPATIENT_CLINIC_OR_DEPARTMENT_OTHER): Payer: Self-pay

## 2023-11-16 MED ORDER — SHINGRIX 50 MCG/0.5ML IM SUSR
0.5000 mL | Freq: Once | INTRAMUSCULAR | 0 refills | Status: AC
  Filled 2023-11-16: qty 0.5, 1d supply, fill #0

## 2023-11-18 ENCOUNTER — Other Ambulatory Visit: Payer: Self-pay | Admitting: Internal Medicine

## 2023-11-26 ENCOUNTER — Other Ambulatory Visit: Payer: Self-pay | Admitting: Internal Medicine

## 2023-11-28 MED ORDER — GLIPIZIDE ER 10 MG PO TB24: 10.0000 mg | ORAL_TABLET | Freq: Every day | ORAL | 1 refills | Status: AC

## 2023-11-29 ENCOUNTER — Other Ambulatory Visit: Payer: Self-pay | Admitting: Internal Medicine

## 2023-12-16 ENCOUNTER — Ambulatory Visit: Admitting: Internal Medicine

## 2023-12-23 ENCOUNTER — Ambulatory Visit: Admitting: Internal Medicine

## 2024-02-12 ENCOUNTER — Other Ambulatory Visit: Payer: Self-pay | Admitting: Internal Medicine

## 2024-03-01 ENCOUNTER — Other Ambulatory Visit: Payer: Self-pay | Admitting: Internal Medicine

## 2024-03-05 ENCOUNTER — Ambulatory Visit: Admitting: Internal Medicine

## 2024-09-07 ENCOUNTER — Ambulatory Visit
